# Patient Record
Sex: Female | Born: 1945
Health system: Southern US, Community
[De-identification: ages and names within clinical notes are randomized; demographics above are authoritative.]

## PROBLEM LIST (undated history)

## (undated) DIAGNOSIS — K5790 Diverticulosis of intestine, part unspecified, without perforation or abscess without bleeding: Secondary | ICD-10-CM

## (undated) DIAGNOSIS — T8859XA Other complications of anesthesia, initial encounter: Secondary | ICD-10-CM

## (undated) DIAGNOSIS — I10 Essential (primary) hypertension: Secondary | ICD-10-CM

## (undated) DIAGNOSIS — E7800 Pure hypercholesterolemia, unspecified: Secondary | ICD-10-CM

## (undated) DIAGNOSIS — C801 Malignant (primary) neoplasm, unspecified: Secondary | ICD-10-CM

## (undated) DIAGNOSIS — Z9889 Other specified postprocedural states: Secondary | ICD-10-CM

## (undated) HISTORY — PX: ABDOMINAL HYSTERECTOMY: SHX81

## (undated) HISTORY — DX: Essential (primary) hypertension: I10

## (undated) HISTORY — PX: BREAST LUMPECTOMY: SHX2

## (undated) HISTORY — DX: Pure hypercholesterolemia, unspecified: E78.00

## (undated) HISTORY — DX: Diverticulosis of intestine, part unspecified, without perforation or abscess without bleeding: K57.90

---

## 1998-08-20 ENCOUNTER — Emergency Department (HOSPITAL_COMMUNITY): Admission: EM | Admit: 1998-08-20 | Discharge: 1998-08-20 | Payer: Self-pay | Admitting: Emergency Medicine

## 1998-08-20 ENCOUNTER — Encounter: Payer: Self-pay | Admitting: Emergency Medicine

## 1999-11-01 ENCOUNTER — Encounter: Admission: RE | Admit: 1999-11-01 | Discharge: 1999-11-01 | Payer: Self-pay | Admitting: Family Medicine

## 1999-11-01 ENCOUNTER — Encounter: Payer: Self-pay | Admitting: Family Medicine

## 2002-01-29 ENCOUNTER — Encounter: Admission: RE | Admit: 2002-01-29 | Discharge: 2002-01-29 | Payer: Self-pay | Admitting: Family Medicine

## 2002-01-29 ENCOUNTER — Encounter: Payer: Self-pay | Admitting: Family Medicine

## 2004-07-22 ENCOUNTER — Encounter: Admission: RE | Admit: 2004-07-22 | Discharge: 2004-07-22 | Payer: Self-pay | Admitting: Family Medicine

## 2007-07-11 ENCOUNTER — Encounter: Admission: RE | Admit: 2007-07-11 | Discharge: 2007-07-11 | Payer: Self-pay | Admitting: Family Medicine

## 2009-11-16 ENCOUNTER — Emergency Department (HOSPITAL_COMMUNITY): Admission: EM | Admit: 2009-11-16 | Discharge: 2009-11-16 | Payer: Self-pay | Admitting: Emergency Medicine

## 2011-06-13 ENCOUNTER — Other Ambulatory Visit: Payer: Self-pay | Admitting: Family Medicine

## 2011-06-13 DIAGNOSIS — Z1231 Encounter for screening mammogram for malignant neoplasm of breast: Secondary | ICD-10-CM

## 2011-07-04 ENCOUNTER — Ambulatory Visit
Admission: RE | Admit: 2011-07-04 | Discharge: 2011-07-04 | Disposition: A | Discharge status: Home / Self Care | Payer: Medicare Other | Source: Ambulatory Visit | Attending: Family Medicine | Admitting: Family Medicine

## 2011-07-04 DIAGNOSIS — Z1231 Encounter for screening mammogram for malignant neoplasm of breast: Secondary | ICD-10-CM

## 2011-08-31 DIAGNOSIS — N951 Menopausal and female climacteric states: Secondary | ICD-10-CM | POA: Diagnosis not present

## 2011-08-31 DIAGNOSIS — E7800 Pure hypercholesterolemia, unspecified: Secondary | ICD-10-CM | POA: Diagnosis not present

## 2011-08-31 DIAGNOSIS — I10 Essential (primary) hypertension: Secondary | ICD-10-CM | POA: Diagnosis not present

## 2011-08-31 DIAGNOSIS — Z23 Encounter for immunization: Secondary | ICD-10-CM | POA: Diagnosis not present

## 2011-08-31 DIAGNOSIS — Z79899 Other long term (current) drug therapy: Secondary | ICD-10-CM | POA: Diagnosis not present

## 2011-08-31 DIAGNOSIS — K219 Gastro-esophageal reflux disease without esophagitis: Secondary | ICD-10-CM | POA: Diagnosis not present

## 2011-09-07 DIAGNOSIS — Z13820 Encounter for screening for osteoporosis: Secondary | ICD-10-CM | POA: Diagnosis not present

## 2011-09-07 DIAGNOSIS — N951 Menopausal and female climacteric states: Secondary | ICD-10-CM | POA: Diagnosis not present

## 2011-12-07 DIAGNOSIS — M779 Enthesopathy, unspecified: Secondary | ICD-10-CM | POA: Diagnosis not present

## 2011-12-07 DIAGNOSIS — M109 Gout, unspecified: Secondary | ICD-10-CM | POA: Diagnosis not present

## 2011-12-07 DIAGNOSIS — M7150 Other bursitis, not elsewhere classified, unspecified site: Secondary | ICD-10-CM | POA: Diagnosis not present

## 2011-12-21 DIAGNOSIS — M779 Enthesopathy, unspecified: Secondary | ICD-10-CM | POA: Diagnosis not present

## 2011-12-21 DIAGNOSIS — M109 Gout, unspecified: Secondary | ICD-10-CM | POA: Diagnosis not present

## 2012-01-18 DIAGNOSIS — M109 Gout, unspecified: Secondary | ICD-10-CM | POA: Diagnosis not present

## 2012-05-21 DIAGNOSIS — Z23 Encounter for immunization: Secondary | ICD-10-CM | POA: Diagnosis not present

## 2012-06-11 ENCOUNTER — Other Ambulatory Visit: Payer: Self-pay | Admitting: Family Medicine

## 2012-06-11 DIAGNOSIS — Z1231 Encounter for screening mammogram for malignant neoplasm of breast: Secondary | ICD-10-CM

## 2012-07-11 ENCOUNTER — Ambulatory Visit
Admission: RE | Admit: 2012-07-11 | Discharge: 2012-07-11 | Disposition: A | Discharge status: Home / Self Care | Payer: Medicare Other | Source: Ambulatory Visit | Attending: Family Medicine | Admitting: Family Medicine

## 2012-07-11 DIAGNOSIS — Z1231 Encounter for screening mammogram for malignant neoplasm of breast: Secondary | ICD-10-CM | POA: Diagnosis not present

## 2012-09-12 DIAGNOSIS — I10 Essential (primary) hypertension: Secondary | ICD-10-CM | POA: Diagnosis not present

## 2012-09-12 DIAGNOSIS — E7800 Pure hypercholesterolemia, unspecified: Secondary | ICD-10-CM | POA: Diagnosis not present

## 2012-09-12 DIAGNOSIS — K219 Gastro-esophageal reflux disease without esophagitis: Secondary | ICD-10-CM | POA: Diagnosis not present

## 2012-09-12 DIAGNOSIS — N951 Menopausal and female climacteric states: Secondary | ICD-10-CM | POA: Diagnosis not present

## 2012-09-12 DIAGNOSIS — Z23 Encounter for immunization: Secondary | ICD-10-CM | POA: Diagnosis not present

## 2012-09-12 DIAGNOSIS — Z79899 Other long term (current) drug therapy: Secondary | ICD-10-CM | POA: Diagnosis not present

## 2013-05-15 DIAGNOSIS — Z23 Encounter for immunization: Secondary | ICD-10-CM | POA: Diagnosis not present

## 2013-07-10 ENCOUNTER — Other Ambulatory Visit: Payer: Self-pay

## 2013-07-10 DIAGNOSIS — Z1231 Encounter for screening mammogram for malignant neoplasm of breast: Secondary | ICD-10-CM

## 2013-09-09 ENCOUNTER — Ambulatory Visit
Admission: RE | Admit: 2013-09-09 | Discharge: 2013-09-09 | Disposition: A | Discharge status: Home / Self Care | Payer: Medicare Other | Source: Ambulatory Visit

## 2013-09-09 DIAGNOSIS — Z1231 Encounter for screening mammogram for malignant neoplasm of breast: Secondary | ICD-10-CM | POA: Diagnosis not present

## 2013-09-18 DIAGNOSIS — I10 Essential (primary) hypertension: Secondary | ICD-10-CM | POA: Diagnosis not present

## 2013-09-18 DIAGNOSIS — M949 Disorder of cartilage, unspecified: Secondary | ICD-10-CM | POA: Diagnosis not present

## 2013-09-18 DIAGNOSIS — M109 Gout, unspecified: Secondary | ICD-10-CM | POA: Diagnosis not present

## 2013-09-18 DIAGNOSIS — E7800 Pure hypercholesterolemia, unspecified: Secondary | ICD-10-CM | POA: Diagnosis not present

## 2013-09-18 DIAGNOSIS — M899 Disorder of bone, unspecified: Secondary | ICD-10-CM | POA: Diagnosis not present

## 2014-05-05 DIAGNOSIS — Z23 Encounter for immunization: Secondary | ICD-10-CM | POA: Diagnosis not present

## 2014-06-01 ENCOUNTER — Telehealth: Payer: Self-pay | Admitting: *Deleted

## 2014-06-01 NOTE — Telephone Encounter (Signed)
My doctor was Ermalinda Barrios I think.  Please call me.  I have already responded to this.

## 2014-06-01 NOTE — Telephone Encounter (Signed)
I'm a patient of [facility] or something like that.  My Gout has flared up and just need to see if he will call me in a prescription.  I called the patient and informed her that Dr. Janus Molder no longer works here.  I told her she can schedule an appointment with another doctor.  She stated, "I really don't need to be seen.  He's treated me for it before.  I just need a prescription."  I told her that they cannot write her a prescription without seeing her.  She stated, "Well nothing has changed.  You still have my chart don't you?  I would rather not have to go through those needles again.  Will that have to be done?"  I told her we may not have that chart.  We are a part of [facility] now and we are on electronic medical records.  You will not have to have all those needles again.  I asked if she would like to schedule an appointment.  She stated, "I know you don't have anything today.  Do you have anything for tomorrow?"  I told her I would have to transfer her to a scheduler.

## 2014-06-02 ENCOUNTER — Ambulatory Visit (INDEPENDENT_AMBULATORY_CARE_PROVIDER_SITE_OTHER): Payer: Medicare Other

## 2014-06-02 ENCOUNTER — Encounter: Payer: Self-pay | Admitting: Podiatry

## 2014-06-02 ENCOUNTER — Ambulatory Visit: Payer: Self-pay

## 2014-06-02 ENCOUNTER — Ambulatory Visit (INDEPENDENT_AMBULATORY_CARE_PROVIDER_SITE_OTHER): Payer: Medicare Other | Admitting: Podiatry

## 2014-06-02 VITALS — BP 147/88 | HR 62 | Resp 16 | Ht 65.0 in | Wt 160.0 lb

## 2014-06-02 DIAGNOSIS — M778 Other enthesopathies, not elsewhere classified: Secondary | ICD-10-CM

## 2014-06-02 DIAGNOSIS — M109 Gout, unspecified: Secondary | ICD-10-CM

## 2014-06-02 DIAGNOSIS — M779 Enthesopathy, unspecified: Secondary | ICD-10-CM

## 2014-06-02 DIAGNOSIS — M10071 Idiopathic gout, right ankle and foot: Secondary | ICD-10-CM

## 2014-06-02 DIAGNOSIS — M7750 Other enthesopathy of unspecified foot: Secondary | ICD-10-CM | POA: Diagnosis not present

## 2014-06-02 MED ORDER — INDOMETHACIN 25 MG PO CAPS: 25.0000 mg | ORAL_CAPSULE | Freq: Two times a day (BID) | ORAL | Status: DC

## 2014-06-02 NOTE — Progress Notes (Signed)
   Subjective:    Patient ID: Melanie Kirby, female    DOB: 05-30-1946, 68 y.o.   MRN: [id number]  HPI Comments: Woke up Saturday morning with the foot swollen and painful , feels like bee stings.     Review of Systems     Objective:   Physical Exam: I have reviewed her past medical history medications allergies surgeries social history and review of systems. Pulses are strongly palpable bilateral. Neurologic sensorium is intact per Semmes-Weinstein monofilament. Deep tendon reflexes are intact bilateral. Muscle strength is 5 over 5 dorsiflexors plantar flexors inverters everters all intrinsic musculature is intact. She has tingling and bee stings on the hallux right greater than left foot are not reproducible with palpation. She also has pain on palpation of the fifth metatarsal base of the right foot with mild edema. Radiographic evaluation does not demonstrate any type of osseous abnormalities or soft tissue irregularities in these areas.        Assessment & Plan:  Assessment: We cannot rule out gout attack because she has previously had one. However this does appear to be more of an overuse injury than gouty arthritis.  Plan: Started her on indomethacin 25 mg 1 by mouth twice a day followup with me as needed.

## 2014-07-07 ENCOUNTER — Encounter: Payer: Self-pay | Admitting: Podiatry

## 2014-07-07 ENCOUNTER — Ambulatory Visit (INDEPENDENT_AMBULATORY_CARE_PROVIDER_SITE_OTHER): Payer: Medicare Other

## 2014-07-07 ENCOUNTER — Ambulatory Visit (INDEPENDENT_AMBULATORY_CARE_PROVIDER_SITE_OTHER): Payer: Medicare Other | Admitting: Podiatry

## 2014-07-07 VITALS — BP 135/68 | HR 68 | Resp 16

## 2014-07-07 DIAGNOSIS — M10072 Idiopathic gout, left ankle and foot: Secondary | ICD-10-CM

## 2014-07-07 DIAGNOSIS — M7752 Other enthesopathy of left foot: Secondary | ICD-10-CM

## 2014-07-07 DIAGNOSIS — M109 Gout, unspecified: Secondary | ICD-10-CM

## 2014-07-07 DIAGNOSIS — M779 Enthesopathy, unspecified: Secondary | ICD-10-CM

## 2014-07-07 DIAGNOSIS — M778 Other enthesopathies, not elsewhere classified: Secondary | ICD-10-CM

## 2014-07-07 MED ORDER — COLCHICINE 0.6 MG PO TABS: ORAL_TABLET | ORAL | Status: DC

## 2014-07-07 NOTE — Progress Notes (Signed)
She presents today for a chief complaint of a gout flareup first metatarsophalangeal joint to her left foot. She denies any trauma.  Objective: Vital signs are stable she is alert and oriented 3. Pulses are strongly palpable left foot. First metatarsophalangeal joint is red-hot swollen and painful. Radiographic evaluation does demonstrate hallux abductovalgus deformity with early osteoarthritic changes. Soft tissue increase in density periarticular. Findings consistent with inflammation such as gout.  Assessment: Gouty capsulitis first metatarsophalangeal joint left foot.  Plan: Start her on colchicine. Injected Kenalog first metatarsophalangeal joint left foot. Follow-up with me in 1 month.

## 2014-07-08 ENCOUNTER — Telehealth: Payer: Self-pay | Admitting: *Deleted

## 2014-07-09 NOTE — Telephone Encounter (Signed)
I'm Dr. Stephenie Acres patient.  I came to the office yesterday about my Gout and he prescribed me a medication that I cannot afford.  It's $375 for 30 pills.  I'm not able to get that medication.  My pharmacist said there is another medication that is a little more reasonable.  It's Allopurinol.  If you could call in a prescription for that maybe I can take it.  I just couldn't afford the other medication.  If you would call me at work phone number.  Thank you so much.

## 2014-07-09 NOTE — Telephone Encounter (Signed)
Allopurinol is not for an acute gout attack.  Colchicine is generic now it should not be that much.  Otherwise just continue the indocin.

## 2014-07-09 NOTE — Telephone Encounter (Signed)
I called and informed her that Dr. Milinda Pointer said Allopurinol is not for acute Gout attacks.  He said the Colchicine is generic now shouldn't have cost that much.  He said to just continue to take the Indocin.  "I said whew we when they told me the price of that medicine and I had said I would just keep taking my old medicine.  Thank you for calling."

## 2014-07-21 ENCOUNTER — Ambulatory Visit: Payer: Medicare Other | Admitting: Podiatry

## 2014-08-04 ENCOUNTER — Other Ambulatory Visit: Payer: Self-pay

## 2014-08-04 DIAGNOSIS — Z1231 Encounter for screening mammogram for malignant neoplasm of breast: Secondary | ICD-10-CM

## 2014-09-10 ENCOUNTER — Ambulatory Visit
Admission: RE | Admit: 2014-09-10 | Discharge: 2014-09-10 | Disposition: A | Discharge status: Home / Self Care | Payer: Medicare Other | Source: Ambulatory Visit

## 2014-09-10 DIAGNOSIS — Z1231 Encounter for screening mammogram for malignant neoplasm of breast: Secondary | ICD-10-CM

## 2014-09-22 DIAGNOSIS — M109 Gout, unspecified: Secondary | ICD-10-CM | POA: Diagnosis not present

## 2014-09-22 DIAGNOSIS — I10 Essential (primary) hypertension: Secondary | ICD-10-CM | POA: Diagnosis not present

## 2014-09-22 DIAGNOSIS — M8580 Other specified disorders of bone density and structure, unspecified site: Secondary | ICD-10-CM | POA: Diagnosis not present

## 2014-09-22 DIAGNOSIS — Z23 Encounter for immunization: Secondary | ICD-10-CM | POA: Diagnosis not present

## 2014-09-22 DIAGNOSIS — E780 Pure hypercholesterolemia: Secondary | ICD-10-CM | POA: Diagnosis not present

## 2014-10-20 DIAGNOSIS — M859 Disorder of bone density and structure, unspecified: Secondary | ICD-10-CM | POA: Diagnosis not present

## 2014-10-20 DIAGNOSIS — M8580 Other specified disorders of bone density and structure, unspecified site: Secondary | ICD-10-CM | POA: Diagnosis not present

## 2015-03-23 DIAGNOSIS — M109 Gout, unspecified: Secondary | ICD-10-CM | POA: Diagnosis not present

## 2015-03-23 DIAGNOSIS — E780 Pure hypercholesterolemia: Secondary | ICD-10-CM | POA: Diagnosis not present

## 2015-03-23 DIAGNOSIS — I10 Essential (primary) hypertension: Secondary | ICD-10-CM | POA: Diagnosis not present

## 2015-03-23 DIAGNOSIS — M8588 Other specified disorders of bone density and structure, other site: Secondary | ICD-10-CM | POA: Diagnosis not present

## 2015-03-23 DIAGNOSIS — K219 Gastro-esophageal reflux disease without esophagitis: Secondary | ICD-10-CM | POA: Diagnosis not present

## 2015-03-23 DIAGNOSIS — R809 Proteinuria, unspecified: Secondary | ICD-10-CM | POA: Diagnosis not present

## 2015-04-11 DIAGNOSIS — L259 Unspecified contact dermatitis, unspecified cause: Secondary | ICD-10-CM | POA: Diagnosis not present

## 2015-05-01 DIAGNOSIS — L259 Unspecified contact dermatitis, unspecified cause: Secondary | ICD-10-CM | POA: Diagnosis not present

## 2015-05-15 DIAGNOSIS — Z23 Encounter for immunization: Secondary | ICD-10-CM | POA: Diagnosis not present

## 2015-08-10 ENCOUNTER — Other Ambulatory Visit: Payer: Self-pay

## 2015-08-10 DIAGNOSIS — Z1231 Encounter for screening mammogram for malignant neoplasm of breast: Secondary | ICD-10-CM

## 2015-09-14 ENCOUNTER — Ambulatory Visit
Admission: RE | Admit: 2015-09-14 | Discharge: 2015-09-14 | Disposition: A | Discharge status: Home / Self Care | Payer: Medicare Other | Source: Ambulatory Visit

## 2015-09-14 DIAGNOSIS — Z1231 Encounter for screening mammogram for malignant neoplasm of breast: Secondary | ICD-10-CM

## 2015-10-07 DIAGNOSIS — M8580 Other specified disorders of bone density and structure, unspecified site: Secondary | ICD-10-CM | POA: Diagnosis not present

## 2015-10-07 DIAGNOSIS — I10 Essential (primary) hypertension: Secondary | ICD-10-CM | POA: Diagnosis not present

## 2015-10-07 DIAGNOSIS — R809 Proteinuria, unspecified: Secondary | ICD-10-CM | POA: Diagnosis not present

## 2015-10-07 DIAGNOSIS — E7800 Pure hypercholesterolemia, unspecified: Secondary | ICD-10-CM | POA: Diagnosis not present

## 2015-10-07 DIAGNOSIS — Z1211 Encounter for screening for malignant neoplasm of colon: Secondary | ICD-10-CM | POA: Diagnosis not present

## 2015-10-07 DIAGNOSIS — K219 Gastro-esophageal reflux disease without esophagitis: Secondary | ICD-10-CM | POA: Diagnosis not present

## 2015-10-07 DIAGNOSIS — M109 Gout, unspecified: Secondary | ICD-10-CM | POA: Diagnosis not present

## 2015-11-18 DIAGNOSIS — D126 Benign neoplasm of colon, unspecified: Secondary | ICD-10-CM | POA: Diagnosis not present

## 2015-11-18 DIAGNOSIS — D120 Benign neoplasm of cecum: Secondary | ICD-10-CM | POA: Diagnosis not present

## 2015-11-18 DIAGNOSIS — K5730 Diverticulosis of large intestine without perforation or abscess without bleeding: Secondary | ICD-10-CM | POA: Diagnosis not present

## 2015-11-18 DIAGNOSIS — Z86010 Personal history of colonic polyps: Secondary | ICD-10-CM | POA: Diagnosis not present

## 2015-11-18 DIAGNOSIS — D125 Benign neoplasm of sigmoid colon: Secondary | ICD-10-CM | POA: Diagnosis not present

## 2015-11-18 DIAGNOSIS — D124 Benign neoplasm of descending colon: Secondary | ICD-10-CM | POA: Diagnosis not present

## 2015-11-18 DIAGNOSIS — K621 Rectal polyp: Secondary | ICD-10-CM | POA: Diagnosis not present

## 2016-04-13 DIAGNOSIS — K219 Gastro-esophageal reflux disease without esophagitis: Secondary | ICD-10-CM | POA: Diagnosis not present

## 2016-04-13 DIAGNOSIS — I10 Essential (primary) hypertension: Secondary | ICD-10-CM | POA: Diagnosis not present

## 2016-04-13 DIAGNOSIS — M8580 Other specified disorders of bone density and structure, unspecified site: Secondary | ICD-10-CM | POA: Diagnosis not present

## 2016-04-13 DIAGNOSIS — E7800 Pure hypercholesterolemia, unspecified: Secondary | ICD-10-CM | POA: Diagnosis not present

## 2016-04-13 DIAGNOSIS — R809 Proteinuria, unspecified: Secondary | ICD-10-CM | POA: Diagnosis not present

## 2016-04-13 DIAGNOSIS — M109 Gout, unspecified: Secondary | ICD-10-CM | POA: Diagnosis not present

## 2016-05-09 DIAGNOSIS — Z23 Encounter for immunization: Secondary | ICD-10-CM | POA: Diagnosis not present

## 2016-08-31 ENCOUNTER — Other Ambulatory Visit: Payer: Self-pay | Admitting: Family Medicine

## 2016-08-31 DIAGNOSIS — Z1231 Encounter for screening mammogram for malignant neoplasm of breast: Secondary | ICD-10-CM

## 2016-09-20 ENCOUNTER — Ambulatory Visit
Admission: RE | Admit: 2016-09-20 | Discharge: 2016-09-20 | Disposition: A | Discharge status: Home / Self Care | Payer: Medicare Other | Source: Ambulatory Visit | Attending: Family Medicine | Admitting: Family Medicine

## 2016-09-20 DIAGNOSIS — Z1231 Encounter for screening mammogram for malignant neoplasm of breast: Secondary | ICD-10-CM

## 2016-09-20 IMAGING — MG MM SCREEN MAMMOGRAM BILATERAL
4 series · 4 of 4 positions shown · non-contrast
Comparison: Previous exam(s).

CLINICAL DATA: Screening.

EXAM:
DIGITAL SCREENING BILATERAL MAMMOGRAM WITH CAD

[R CC]
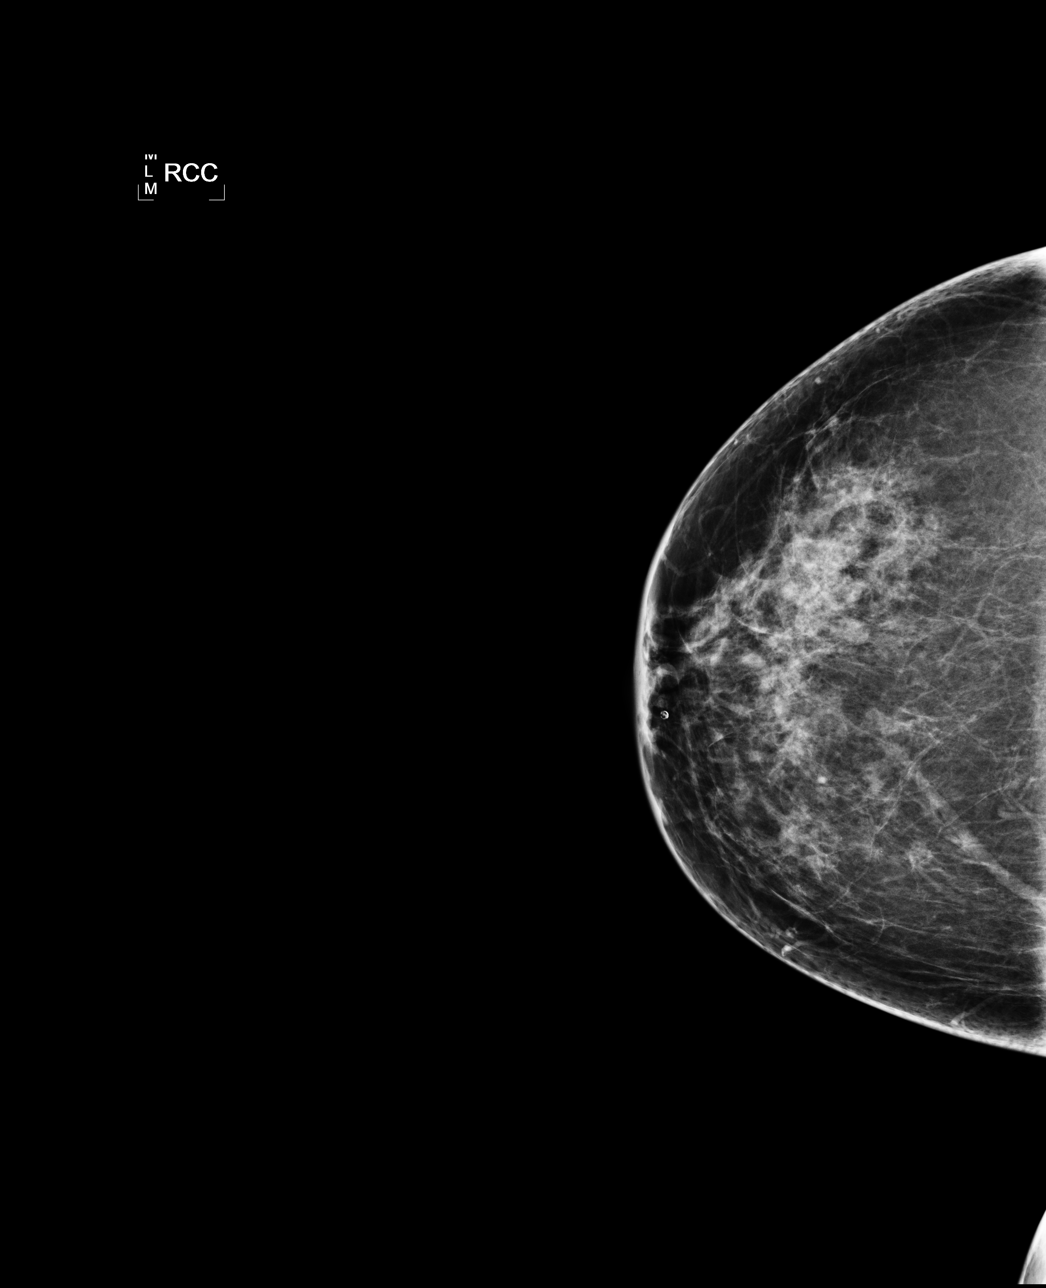

[L CC]
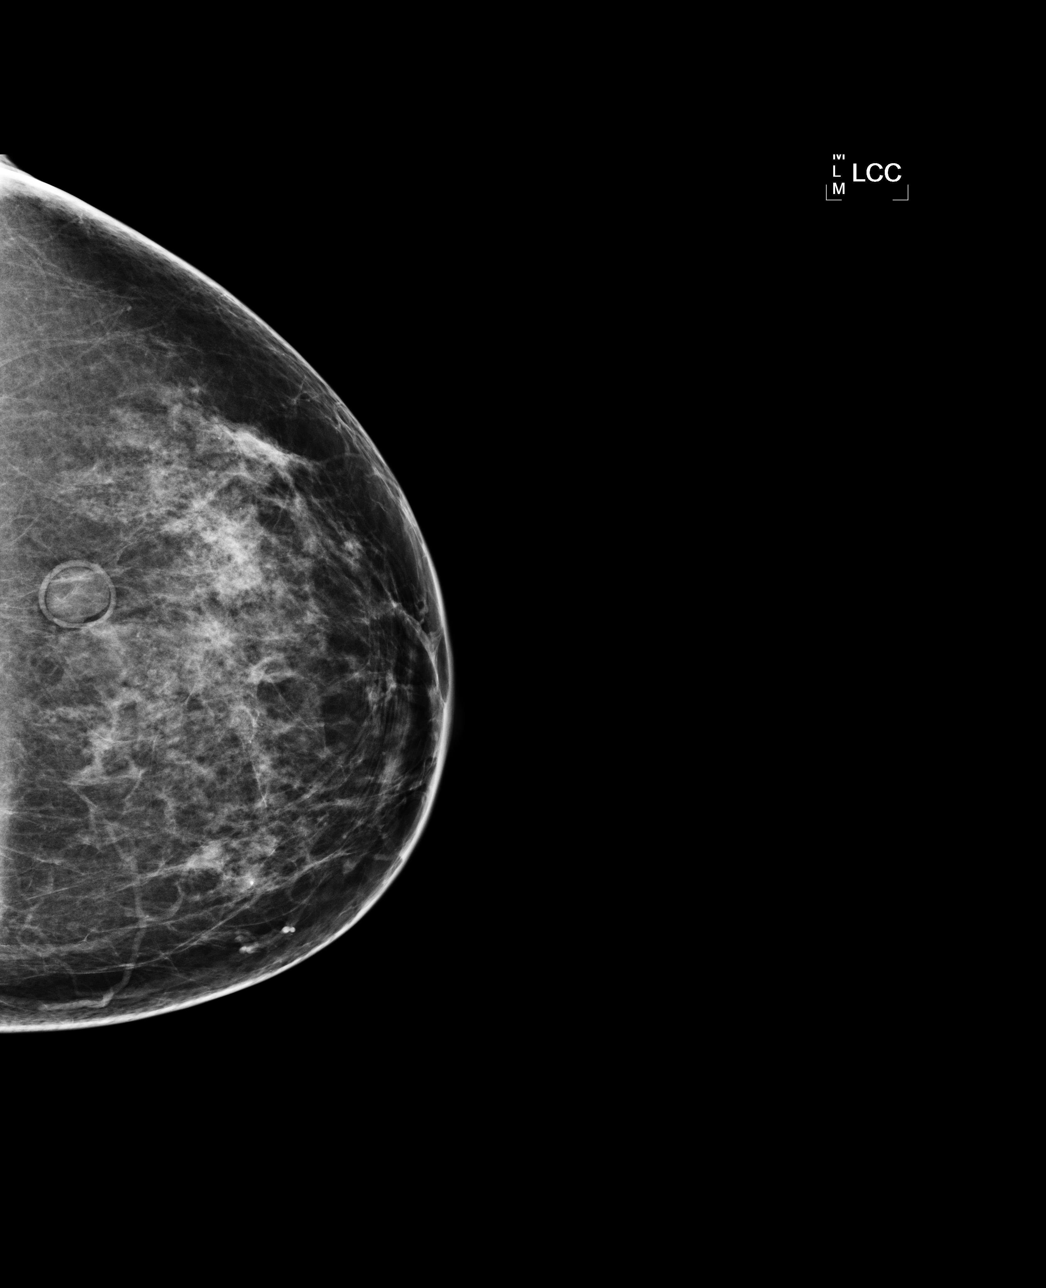

[L MLO]
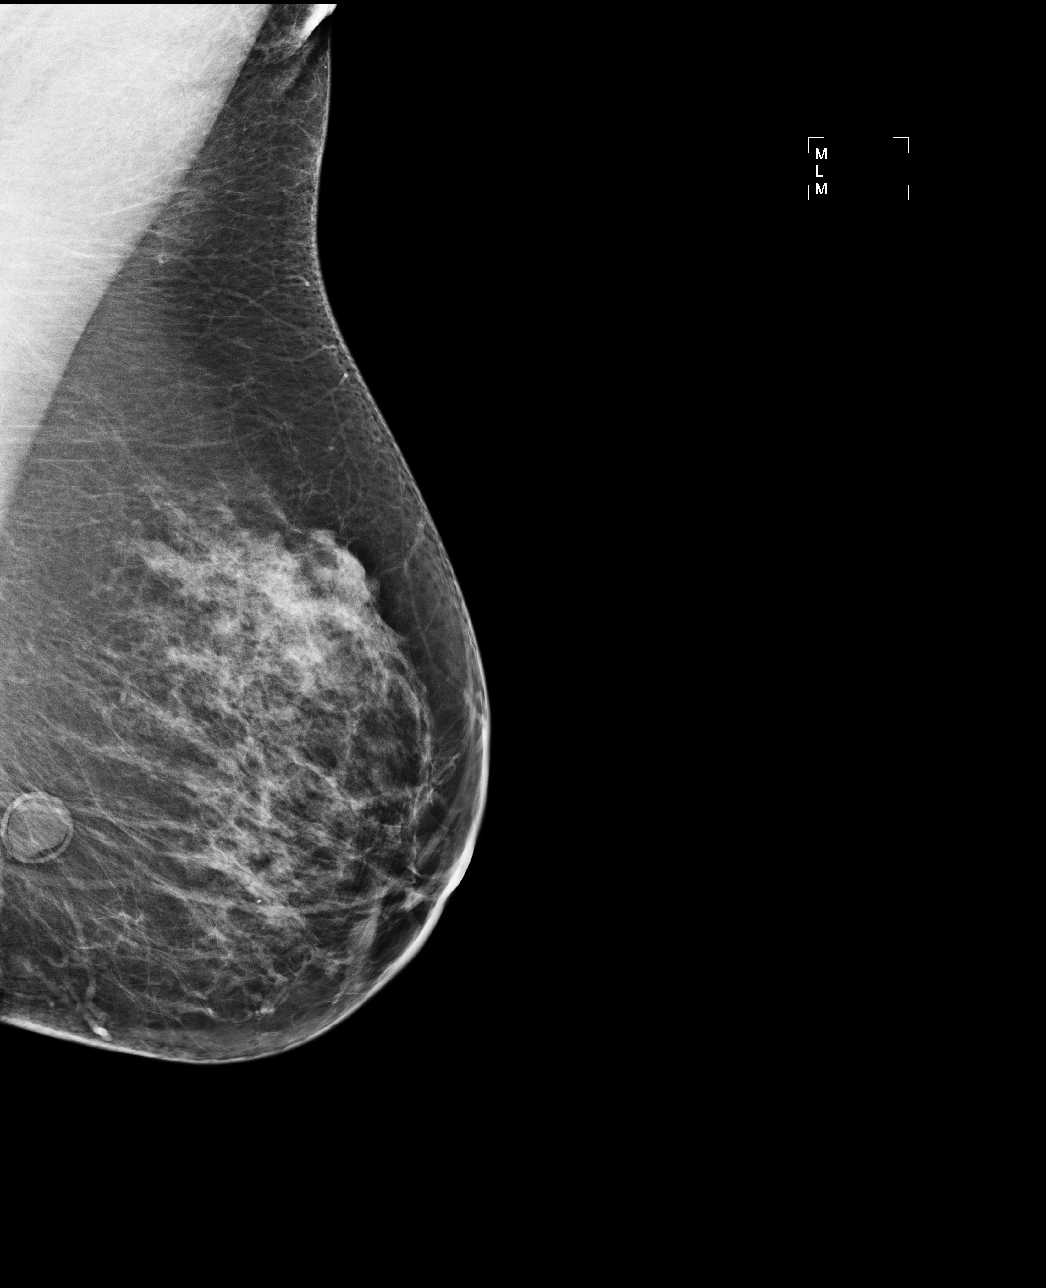

[R MLO]
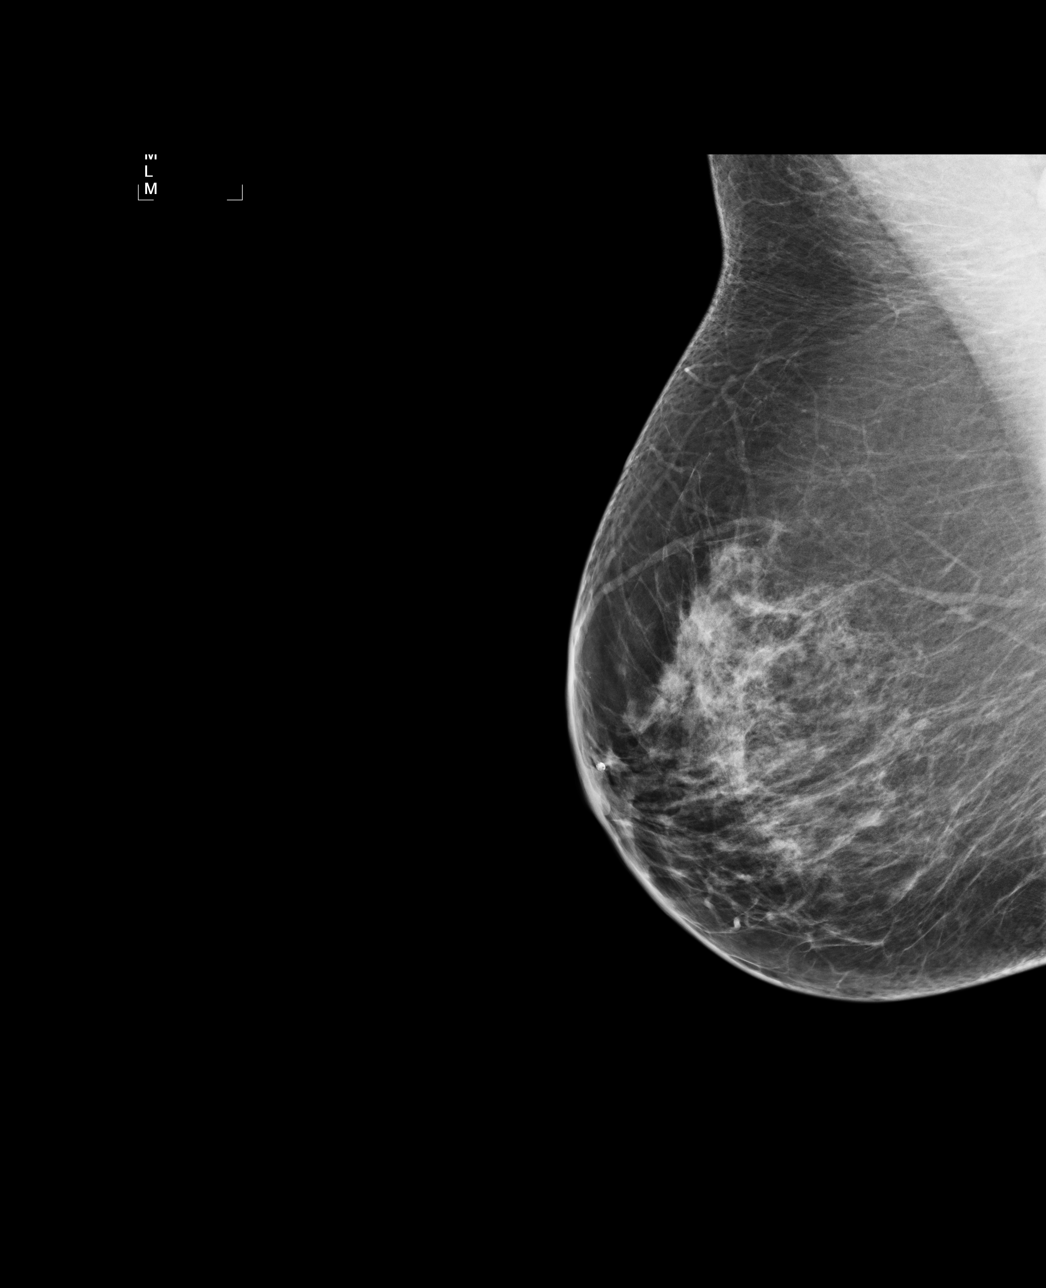

[4 of 4 positions shown; findings below may reference images not displayed]

ACR Breast Density Category d: The breast tissue is extremely dense,
which lowers the sensitivity of mammography.
FINDINGS: There are no findings suspicious for malignancy. Images were
processed with CAD.
IMPRESSION: No mammographic evidence of malignancy. A result letter of this
screening mammogram will be mailed directly to the patient.

RECOMMENDATION:
Screening mammogram in one year. (Code:BD-D-K0F)

BI-RADS CATEGORY  1: Negative.

## 2017-08-29 ENCOUNTER — Other Ambulatory Visit: Payer: Self-pay | Admitting: Family Medicine

## 2017-08-29 DIAGNOSIS — Z1231 Encounter for screening mammogram for malignant neoplasm of breast: Secondary | ICD-10-CM

## 2017-09-13 ENCOUNTER — Encounter: Payer: Self-pay | Admitting: Podiatry

## 2017-09-13 ENCOUNTER — Ambulatory Visit: Payer: Medicare Other | Admitting: Podiatry

## 2017-09-13 ENCOUNTER — Ambulatory Visit (INDEPENDENT_AMBULATORY_CARE_PROVIDER_SITE_OTHER): Payer: Medicare Other

## 2017-09-13 DIAGNOSIS — M109 Gout, unspecified: Secondary | ICD-10-CM

## 2017-09-13 MED ORDER — COLCHICINE 0.6 MG PO TABS: ORAL_TABLET | ORAL | 3 refills | Status: DC

## 2017-09-13 MED ORDER — INDOMETHACIN 25 MG PO CAPS: 25.0000 mg | ORAL_CAPSULE | Freq: Three times a day (TID) | ORAL | 1 refills | Status: DC

## 2017-09-13 NOTE — Progress Notes (Signed)
  Subjective:  Patient ID: Melanie Kirby, female    DOB: Sep 03, 1945,  MRN: [id number] HPI Chief Complaint  Patient presents with  . Foot Pain    1st MPJ right - "gout flare", redness, swelling, last flare in July 2018-went to [facility] - they gave injection, colchicin, allopurinol, stopped meds after foot was better, had current flare x 3 weeks, better, but not well    72 y.o. female presents with the above complaint.     Past Medical History:  Diagnosis Date  . High cholesterol   . Hypertension      Current Outpatient Medications:  Marland Kitchen  Multiple Vitamin (MULTIVITAMIN) capsule, Take 1 capsule by mouth daily., Disp: , Rfl:  .  Omega-3 Fatty Acids (FISH OIL PO), Take by mouth., Disp: , Rfl:  .  benazepril (LOTENSIN) 40 MG tablet, , Disp: , Rfl:  .  colchicine 0.6 MG tablet, Take one tablet by mouth every two hours to a maximum of three tablets or until upset stomach.  Then one by mouth daily there after., Disp: 60 tablet, Rfl: 1 .  simvastatin (ZOCOR) 40 MG tablet, , Disp: , Rfl:   No Known Allergies Review of Systems  Musculoskeletal: Positive for arthralgias.  All other systems reviewed and are negative.  Objective:  There were no vitals filed for this visit.  General: Well developed, nourished, in no acute distress, alert and oriented x3   Dermatological: Skin is warm, dry and supple bilateral. Nails x 10 are well maintained; remaining integument appears unremarkable at this time. There are no open sores, no preulcerative lesions, no rash or signs of infection present.  Vascular: Dorsalis Pedis artery and Posterior Tibial artery pedal pulses are 2/4 bilateral with immedate capillary fill time. Pedal hair growth present. No varicosities and no lower extremity edema present bilateral.   Neruologic: Grossly intact via light touch bilateral. Vibratory intact via tuning fork bilateral. Protective threshold with Semmes Wienstein monofilament intact to all pedal sites bilateral.  Patellar and Achilles deep tendon reflexes 2+ bilateral. No Babinski or clonus noted bilateral.   Musculoskeletal: No gross boney pedal deformities bilateral. No pain, crepitus, or limitation noted with foot and ankle range of motion bilateral. Muscular strength 5/5 in all groups tested bilateral.  Gait: Unassisted, Nonantalgic.    Radiographs:  3 views right foot were taken in the office today demonstrating relatively normal anatomical rectus foot type.  She does have joint space narrowing of the first metatarsophalangeal joint with soft tissue thickening around the joint.  This is most consistent with gout.  Or swelling around the joint.  There is some cystic degeneration of the joint.  Again consistent with gout.  Assessment & Plan:   Assessment: Gouty capsulitis first metatarsophalangeal joint right foot resolving.  Plan: Start her back on colchicine daily allopurinol was causing muscle pain for her.  She cannot use [facility] [place] in the past with her primary doctor.  I also provided her with 25 mg of indomethacin if necessary.  Offered her injection she declined.  Follow-up with [place] as needed.  Recommend that she follow-up with [place] immediately upon developing a gout attack.       Melanie Kirby T. [place], [place]

## 2017-09-21 ENCOUNTER — Ambulatory Visit
Admission: RE | Admit: 2017-09-21 | Discharge: 2017-09-21 | Disposition: A | Discharge status: Home / Self Care | Payer: Medicare Other | Source: Ambulatory Visit | Attending: Family Medicine | Admitting: Family Medicine

## 2017-09-21 DIAGNOSIS — Z1231 Encounter for screening mammogram for malignant neoplasm of breast: Secondary | ICD-10-CM

## 2018-08-14 ENCOUNTER — Other Ambulatory Visit: Payer: Self-pay | Admitting: Family Medicine

## 2018-08-14 DIAGNOSIS — Z1231 Encounter for screening mammogram for malignant neoplasm of breast: Secondary | ICD-10-CM

## 2018-09-23 ENCOUNTER — Other Ambulatory Visit: Payer: Self-pay | Admitting: Podiatry

## 2018-09-24 ENCOUNTER — Telehealth: Payer: Self-pay | Admitting: Podiatry

## 2018-09-24 ENCOUNTER — Ambulatory Visit
Admission: RE | Admit: 2018-09-24 | Discharge: 2018-09-24 | Disposition: A | Discharge status: Home / Self Care | Payer: Medicare Other | Source: Ambulatory Visit | Attending: Family Medicine | Admitting: Family Medicine

## 2018-09-24 DIAGNOSIS — Z1231 Encounter for screening mammogram for malignant neoplasm of breast: Secondary | ICD-10-CM

## 2018-09-24 NOTE — Telephone Encounter (Signed)
I have gout and need my colchicine refilled. I use Melanie Kirby at [facility].

## 2018-09-24 NOTE — Telephone Encounter (Signed)
Left message informing pt Dr. Milinda Pointer had wanted her to make an appt immediately if she had a gout flare.

## 2018-10-01 ENCOUNTER — Ambulatory Visit: Payer: Medicare Other | Admitting: Podiatry

## 2018-10-01 ENCOUNTER — Encounter: Payer: Self-pay | Admitting: Podiatry

## 2018-10-01 DIAGNOSIS — M109 Gout, unspecified: Secondary | ICD-10-CM | POA: Diagnosis not present

## 2018-10-01 MED ORDER — COLCHICINE 0.6 MG PO TABS: ORAL_TABLET | ORAL | 3 refills | Status: DC

## 2018-10-01 NOTE — Progress Notes (Signed)
She presents today for refill of her colchicine.  She is taking a daily maintenance dose and is currently out of the medication and she does not want another flareup.  She states that it seems that the colchicine has been taking care of even her plantar fasciitis and the pain across the dorsum of her foot.  She denies any gout flareups in the past year denies any change in her past medical history medications or allergies.  Objective: Vital signs are stable alert oriented x3 there is no erythema edema cellulitis drainage or odor no change in her physical exam.  Assessment: History of gout.  Plan: Discussed etiology pathology and surgical therapies follow-up with her in 1 year.  Dispensed a prescription for colchicine 0.6 mg 1 tablet daily.

## 2019-08-18 ENCOUNTER — Other Ambulatory Visit: Payer: Self-pay | Admitting: Family Medicine

## 2019-08-18 DIAGNOSIS — Z1231 Encounter for screening mammogram for malignant neoplasm of breast: Secondary | ICD-10-CM

## 2019-09-30 ENCOUNTER — Ambulatory Visit (INDEPENDENT_AMBULATORY_CARE_PROVIDER_SITE_OTHER): Payer: Medicare Other

## 2019-09-30 ENCOUNTER — Ambulatory Visit (INDEPENDENT_AMBULATORY_CARE_PROVIDER_SITE_OTHER): Payer: Medicare Other | Admitting: Podiatry

## 2019-09-30 ENCOUNTER — Encounter: Payer: Self-pay | Admitting: Podiatry

## 2019-09-30 ENCOUNTER — Other Ambulatory Visit: Payer: Self-pay

## 2019-09-30 DIAGNOSIS — M778 Other enthesopathies, not elsewhere classified: Secondary | ICD-10-CM

## 2019-09-30 DIAGNOSIS — M109 Gout, unspecified: Secondary | ICD-10-CM

## 2019-09-30 MED ORDER — METHYLPREDNISOLONE 4 MG PO TBPK
ORAL_TABLET | ORAL | 0 refills | Status: DC
Start: 2019-09-30 — End: 2021-09-06

## 2019-09-30 MED ORDER — COLCHICINE 0.6 MG PO TABS: ORAL_TABLET | ORAL | 3 refills | Status: DC

## 2019-09-30 NOTE — Progress Notes (Signed)
She presents today after having not seen her since 1 year ago states that she ran out of gout medicine, colchicine about a week ago and sure enough she developed a gout just a few days ago.  States that the first metatarsophalangeal joint and dorsal midfoot is exquisitely painful and now the foot is red and painful right.  She denies any trauma denies any new past medical history medications allergies surgeries or social history.  Objective: Pulses are palpable.  The dorsum of the foot is red and painful.  Mildly edematous tender to the touch.  Radiographs taken today demonstrate osteoarthritis first metatarsophalangeal joint and second metatarsal intermediate cuneiform joint.  Assessment: Gouty arthritis right foot.  Plan: Started by her back on colchicine today.  I injected the area with 20 mg Kenalog 5 mg of Marcaine dorsal aspect of the right foot.  Also started on a Medrol Dosepak.

## 2019-10-03 ENCOUNTER — Ambulatory Visit
Admission: RE | Admit: 2019-10-03 | Discharge: 2019-10-03 | Disposition: A | Discharge status: Home / Self Care | Payer: Medicare Other | Source: Ambulatory Visit | Attending: Family Medicine | Admitting: Family Medicine

## 2019-10-03 ENCOUNTER — Other Ambulatory Visit: Payer: Self-pay

## 2019-10-03 DIAGNOSIS — Z1231 Encounter for screening mammogram for malignant neoplasm of breast: Secondary | ICD-10-CM

## 2020-08-26 ENCOUNTER — Other Ambulatory Visit: Payer: Self-pay | Admitting: Family Medicine

## 2020-08-26 DIAGNOSIS — Z1231 Encounter for screening mammogram for malignant neoplasm of breast: Secondary | ICD-10-CM

## 2020-08-31 ENCOUNTER — Ambulatory Visit (INDEPENDENT_AMBULATORY_CARE_PROVIDER_SITE_OTHER): Payer: Medicare Other | Admitting: Podiatry

## 2020-08-31 ENCOUNTER — Other Ambulatory Visit: Payer: Self-pay

## 2020-08-31 DIAGNOSIS — M778 Other enthesopathies, not elsewhere classified: Secondary | ICD-10-CM

## 2020-08-31 DIAGNOSIS — M109 Gout, unspecified: Secondary | ICD-10-CM | POA: Diagnosis not present

## 2020-08-31 MED ORDER — COLCHICINE 0.6 MG PO TABS: 0.6000 mg | ORAL_TABLET | Freq: Every day | ORAL | 3 refills | Status: DC

## 2020-08-31 MED ORDER — NEOMYCIN-POLYMYXIN-HC 3.5-10000-1 OT SOLN: OTIC | 0 refills | Status: DC

## 2020-08-31 NOTE — Progress Notes (Signed)
She presents today for annual foot examination.  States that she needs a refill on her colchicine will be running out soon in a similar thing keep her gout under control.  She denies any additions to her medication list she not denies any withdrawals from her medication list.  She denies any change in her past medical history medications allergies surgeries or social history.  Objective: Vital signs are stable she alert oriented x3 pulses are palpable.  There is no erythema edema cellulitis drainage or odor.  She does have a subungual hematoma fibular side of the hallux left no signs of infection.  She has neurologic sensorium is intact per Semmes Weinstein monofilament deep tendon reflexes are intact muscle strength is normal symmetrical bilateral.  Orthopedic evaluation demonstrates normal rectus foot no complications or pain on range of motion of the toes.  Assessment: Subungual hematoma hallux left no signs of infection and history of hyperuricemia.  Plan: I will follow-up with her in 1 year should she have questions or concerns before then she will notify us immediately.  I did refill her colchicine for her.

## 2020-10-20 ENCOUNTER — Ambulatory Visit: Payer: Medicare Other

## 2020-11-10 DIAGNOSIS — M109 Gout, unspecified: Secondary | ICD-10-CM | POA: Diagnosis not present

## 2020-11-10 DIAGNOSIS — I10 Essential (primary) hypertension: Secondary | ICD-10-CM | POA: Diagnosis not present

## 2020-11-10 DIAGNOSIS — K219 Gastro-esophageal reflux disease without esophagitis: Secondary | ICD-10-CM | POA: Diagnosis not present

## 2020-11-10 DIAGNOSIS — E7800 Pure hypercholesterolemia, unspecified: Secondary | ICD-10-CM | POA: Diagnosis not present

## 2020-11-10 DIAGNOSIS — Z0000 Encounter for general adult medical examination without abnormal findings: Secondary | ICD-10-CM | POA: Diagnosis not present

## 2020-12-06 ENCOUNTER — Inpatient Hospital Stay: Admission: RE | Admit: 2020-12-06 | Payer: Medicare Other | Source: Ambulatory Visit

## 2020-12-06 ENCOUNTER — Ambulatory Visit
Admission: RE | Admit: 2020-12-06 | Discharge: 2020-12-06 | Disposition: A | Discharge status: Home / Self Care | Payer: Medicare Other | Source: Ambulatory Visit | Attending: Family Medicine | Admitting: Family Medicine

## 2020-12-06 ENCOUNTER — Other Ambulatory Visit: Payer: Self-pay

## 2020-12-06 DIAGNOSIS — Z1231 Encounter for screening mammogram for malignant neoplasm of breast: Secondary | ICD-10-CM

## 2020-12-22 ENCOUNTER — Telehealth: Payer: Self-pay | Admitting: *Deleted

## 2020-12-22 NOTE — Telephone Encounter (Signed)
Returned call back to patient, no answer, left vmessage per doctor concerning colchicine.

## 2020-12-22 NOTE — Telephone Encounter (Signed)
Patient is calling to request a generic or replacement of the colchicine, keeps going up in price. Please advise.

## 2021-01-25 DIAGNOSIS — I10 Essential (primary) hypertension: Secondary | ICD-10-CM | POA: Diagnosis not present

## 2021-01-25 DIAGNOSIS — L255 Unspecified contact dermatitis due to plants, except food: Secondary | ICD-10-CM | POA: Diagnosis not present

## 2021-01-27 ENCOUNTER — Other Ambulatory Visit: Payer: Self-pay

## 2021-01-27 ENCOUNTER — Emergency Department (HOSPITAL_COMMUNITY)
Admission: EM | Admit: 2021-01-27 | Discharge: 2021-01-27 | Disposition: A | Discharge status: Home / Self Care | Payer: Medicare Other | Attending: Emergency Medicine | Admitting: Emergency Medicine

## 2021-01-27 ENCOUNTER — Emergency Department (HOSPITAL_COMMUNITY): Payer: Medicare Other

## 2021-01-27 DIAGNOSIS — Z79899 Other long term (current) drug therapy: Secondary | ICD-10-CM | POA: Insufficient documentation

## 2021-01-27 DIAGNOSIS — W108XXA Fall (on) (from) other stairs and steps, initial encounter: Secondary | ICD-10-CM | POA: Insufficient documentation

## 2021-01-27 DIAGNOSIS — S01511A Laceration without foreign body of lip, initial encounter: Secondary | ICD-10-CM

## 2021-01-27 DIAGNOSIS — S022XXA Fracture of nasal bones, initial encounter for closed fracture: Secondary | ICD-10-CM

## 2021-01-27 DIAGNOSIS — I10 Essential (primary) hypertension: Secondary | ICD-10-CM | POA: Diagnosis not present

## 2021-01-27 DIAGNOSIS — W19XXXA Unspecified fall, initial encounter: Secondary | ICD-10-CM

## 2021-01-27 DIAGNOSIS — S0992XA Unspecified injury of nose, initial encounter: Secondary | ICD-10-CM | POA: Diagnosis present

## 2021-01-27 DIAGNOSIS — S0101XA Laceration without foreign body of scalp, initial encounter: Secondary | ICD-10-CM

## 2021-01-27 DIAGNOSIS — S0990XA Unspecified injury of head, initial encounter: Secondary | ICD-10-CM

## 2021-01-27 MED ORDER — OXYCODONE-ACETAMINOPHEN 5-325 MG PO TABS
1.0000 | ORAL_TABLET | Freq: Once | ORAL | Status: AC
  Administered 2021-01-27: 1 via ORAL
  Filled 2021-01-27: qty 1

## 2021-01-27 MED ORDER — LIDOCAINE HCL (PF) 1 % IJ SOLN
5.0000 mL | Freq: Once | INTRAMUSCULAR | Status: AC
  Administered 2021-01-27: 5 mL
  Filled 2021-01-27: qty 5

## 2021-01-27 MED ORDER — METHOCARBAMOL 500 MG PO TABS: 500.0000 mg | ORAL_TABLET | Freq: Three times a day (TID) | ORAL | 0 refills | Status: DC | PRN

## 2021-01-27 NOTE — ED Provider Notes (Signed)
[facility] EMERGENCY DEPARTMENT Provider Note   CSN: [id number] Arrival date & time: 01/27/21  1533     History Chief Complaint  Patient presents with  . Fall  . Head Injury    Melanie Kirby is a 75 y.o. female.  HPI Patient resents after a fall from attic.  Report fell down 3-4 steps.  Landed on her back and face.  Complaining of pain mostly in her face and head.  Some mild mid back pain.  No difficulty breathing.  No lightheadedness or dizziness.  Thinks her tetanus is up-to-date.  Not on blood thinners.  No vision changes.    Past Medical History:  Diagnosis Date  . High cholesterol   . Hypertension     There are no problems to display for this patient.   No past surgical history on file.   OB History   No obstetric history on file.     No family history on file.  Social History   Tobacco Use  . Smoking status: Never Smoker  . Smokeless tobacco: Never Used    Home Medications Prior to Admission medications   Medication Sig Start Date End Date Taking? Authorizing Provider  methocarbamol (ROBAXIN) 500 MG tablet Take 1 tablet (500 mg total) by mouth every 8 (eight) hours as needed for muscle spasms. 01/27/21  Yes Davonna Belling, MD  benazepril (LOTENSIN) 40 MG tablet  08/11/17   [provider]  colchicine 0.6 MG tablet Take 1 tablet TID until GI upset or flare subsides, then for maintenance, take 1 tablet daily. 09/30/19   Hyatt, Max T, DPM  colchicine 0.6 MG tablet Take 1 tablet (0.6 mg total) by mouth daily. 08/31/20   Hyatt, Max T, DPM  indomethacin (INDOCIN) 25 MG capsule Take 1 capsule (25 mg total) by mouth 3 (three) times daily with meals. 09/13/17   Hyatt, Max T, DPM  methylPREDNISolone (MEDROL DOSEPAK) 4 MG TBPK tablet 6 day dose pack - take as directed 09/30/19   Hyatt, Max T, DPM  Multiple Vitamin (MULTIVITAMIN) capsule Take 1 capsule by mouth daily.    [provider]  Omega-3 Fatty Acids (FISH OIL PO)  Take by mouth.    [provider]  simvastatin (ZOCOR) 40 MG tablet  08/25/17   [provider]    Allergies    Allopurinol and Other  Review of Systems   Review of Systems  Constitutional: Negative for activity change and fever.  HENT: Positive for facial swelling. Negative for mouth sores and trouble swallowing.   Respiratory: Negative for shortness of breath.   Cardiovascular: Negative for chest pain.  Gastrointestinal: Negative for abdominal pain.  Endocrine: Negative for polyuria.  Genitourinary: Negative for flank pain.  Musculoskeletal: Positive for back pain.  Skin: Positive for wound. Negative for rash.  Neurological: Positive for headaches.  Psychiatric/Behavioral: Negative for confusion.    Physical Exam Updated Vital Signs BP (!) 154/75   Pulse (!) 56   Temp 97.7 F (36.5 C) (Oral)   Resp 10   Ht 5\' 5"  (1.651 m)   Wt 76.2 kg   SpO2 99%   BMI 27.96 kg/m   Physical Exam Vitals and nursing note reviewed.  HENT:     Head:     Comments: Hematoma with approximately 7 cm laceration horizontally of upon left side of scalp.   Also approximately 1 cm through and through vertical wound on the midline directly below the nose.  No involvement of vermilion border.  Nose:     Comments: Tenderness over bridge of nose with some swelling. Eyes:     Extraocular Movements: Extraocular movements intact.     Pupils: Pupils are equal, round, and reactive to light.  Cardiovascular:     Rate and Rhythm: Normal rate.  Chest:     Chest wall: No tenderness.  Abdominal:     Tenderness: There is no abdominal tenderness.  Musculoskeletal:        General: No tenderness.     Cervical back: Neck supple.     Comments: no cervical thoracic or lumbar spine tenderness.  No back tenderness.  Skin:    General: Skin is warm.     Capillary Refill: Capillary refill takes less than 2 seconds.  Neurological:     Mental Status: She is alert and oriented to person, place,  and time.     ED Results / Procedures / Treatments   Labs (all labs ordered are listed, but only abnormal results are displayed) Labs Reviewed - No data to display  EKG None  Radiology CT Head Wo Contrast  Result Date: 01/27/2021 CLINICAL DATA:  Head trauma.  Fall. EXAM: CT HEAD WITHOUT CONTRAST CT MAXILLOFACIAL WITHOUT CONTRAST TECHNIQUE: Multidetector CT imaging of the head and maxillofacial structures were performed using the standard protocol without intravenous contrast. Multiplanar CT image reconstructions of the maxillofacial structures were also generated. COMPARISON:  None. FINDINGS: CT HEAD FINDINGS Brain: No evidence of acute infarction, hemorrhage, hydrocephalus, or extra-axial fluid collection. Similar size/appearance of a 11 mm discrete hypodensity in the inferior right basal ganglia. Moderate patchy white matter hypoattenuation, most likely related to chronic microvascular disease. Vascular: No hyperdense vessel identified. Calcific atherosclerosis. Skull: Similar small osteoma arising from the outer table of the right paramidline frontal calvarium. Contusion at the vertex and involving the midline frontal scalp without acute calvarial fracture Other: No mastoid effusions. CT MAXILLOFACIAL FINDINGS Osseous: Bilateral nasal bone fractures, comminuted and mildly displaced on the left. Nondisplaced fracture of the anterior maxillary spine. Bilateral TMJ joints are located. Orbits: Negative. No traumatic or inflammatory finding. Sinuses: Mild scattered ethmoid air cell mucosal thickening and opacification. Small amount of fluid layering in the left maxillary sinus and right sphenoid sinus. Rightward nasal septal deviation with bony spur. Soft tissues: Frontal midline scalp contusion/laceration. Nasal contusion. IMPRESSION: CT head: 1. No evidence of acute intracranial abnormality.Midline frontal scalp and vertex contusions/lacerations without acute calvarial fracture. 2. Similar 11 mm  hypodensity in the inferior right basal ganglia, most likely a dilated perivascular space (favored), benign cyst, or remote infarct. 3. Moderate chronic microvascular ischemic disease. CT maxillofacial: 1. Bilateral nasal bone fractures, comminuted and mildly displaced on the left. Overlying nasal contusion. 2. Nondisplaced fracture of the anterior maxillary spine. Electronically Signed   By: Margaretha Sheffield MD   On: 01/27/2021 17:21   CT Maxillofacial Wo Contrast  Result Date: 01/27/2021 CLINICAL DATA:  Head trauma.  Fall. EXAM: CT HEAD WITHOUT CONTRAST CT MAXILLOFACIAL WITHOUT CONTRAST TECHNIQUE: Multidetector CT imaging of the head and maxillofacial structures were performed using the standard protocol without intravenous contrast. Multiplanar CT image reconstructions of the maxillofacial structures were also generated. COMPARISON:  None. FINDINGS: CT HEAD FINDINGS Brain: No evidence of acute infarction, hemorrhage, hydrocephalus, or extra-axial fluid collection. Similar size/appearance of a 11 mm discrete hypodensity in the inferior right basal ganglia. Moderate patchy white matter hypoattenuation, most likely related to chronic microvascular disease. Vascular: No hyperdense vessel identified. Calcific atherosclerosis. Skull: Similar small osteoma arising from the outer table  of the right paramidline frontal calvarium. Contusion at the vertex and involving the midline frontal scalp without acute calvarial fracture Other: No mastoid effusions. CT MAXILLOFACIAL FINDINGS Osseous: Bilateral nasal bone fractures, comminuted and mildly displaced on the left. Nondisplaced fracture of the anterior maxillary spine. Bilateral TMJ joints are located. Orbits: Negative. No traumatic or inflammatory finding. Sinuses: Mild scattered ethmoid air cell mucosal thickening and opacification. Small amount of fluid layering in the left maxillary sinus and right sphenoid sinus. Rightward nasal septal deviation with bony spur.  Soft tissues: Frontal midline scalp contusion/laceration. Nasal contusion. IMPRESSION: CT head: 1. No evidence of acute intracranial abnormality.Midline frontal scalp and vertex contusions/lacerations without acute calvarial fracture. 2. Similar 11 mm hypodensity in the inferior right basal ganglia, most likely a dilated perivascular space (favored), benign cyst, or remote infarct. 3. Moderate chronic microvascular ischemic disease. CT maxillofacial: 1. Bilateral nasal bone fractures, comminuted and mildly displaced on the left. Overlying nasal contusion. 2. Nondisplaced fracture of the anterior maxillary spine. Electronically Signed   By: Margaretha Sheffield MD   On: 01/27/2021 17:21    Procedures .Marland KitchenLaceration Repair  Date/Time: 01/27/2021 8:00 PM Performed by: Davonna Belling, MD Authorized by: Davonna Belling, MD   Consent:    Consent obtained:  Verbal   Consent given by:  Patient   Risks, benefits, and alternatives were discussed: yes     Risks discussed:  Infection   Alternatives discussed:  No treatment and delayed treatment Anesthesia:    Anesthesia method:  Local infiltration   Local anesthetic:  Lidocaine 1% w/o epi Laceration details:    Location:  Scalp   Scalp location:  Frontal   Length (cm):  7 Pre-procedure details:    Preparation:  Patient was prepped and draped in usual sterile fashion and imaging obtained to evaluate for foreign bodies Exploration:    Limited defect created (wound extended): no     Wound exploration: wound explored through full range of motion     Contaminated: no   Treatment:    Area cleansed with:  Saline   Amount of cleaning:  Standard   Irrigation method:  Syringe   Debridement:  None Skin repair:    Repair method:  Sutures   Suture size:  4-0   Suture material:  Prolene   Suture technique:  Simple interrupted   Number of sutures:  10 Approximation:    Approximation:  Close Repair type:    Repair type:  Simple Post-procedure details:     Dressing:  Sterile dressing   Procedure completion:  Tolerated well, no immediate complications .Marland KitchenLaceration Repair  Date/Time: 01/27/2021 8:00 PM Performed by: Davonna Belling, MD Authorized by: Davonna Belling, MD   Consent:    Consent obtained:  Verbal   Consent given by:  Patient   Alternatives discussed:  No treatment Universal protocol:    Procedure explained and questions answered to patient or proxy's satisfaction: yes   Anesthesia:    Anesthesia method:  Local infiltration   Local anesthetic:  Lidocaine 1% w/o epi Laceration details:    Location:  Lip   Lip location:  Upper lip, full thickness   Vermilion border involved: no     Length (cm):  1.5 Pre-procedure details:    Preparation:  Patient was prepped and draped in usual sterile fashion Exploration:    Limited defect created (wound extended): no     Contaminated: no   Treatment:    Area cleansed with:  Saline   Debridement:  None   Layers/structures  repaired:  Deep subcutaneous Deep subcutaneous:    Suture size:  5-0   Suture material:  Vicryl   Number of sutures:  3 Skin repair:    Repair method:  Sutures   Suture size:  4-0   Suture material:  Prolene   Number of sutures:  8 Approximation:    Approximation:  Close Repair type:    Repair type:  Simple Post-procedure details:    Dressing:  Sterile dressing   Procedure completion:  Tolerated well, no immediate complications     Medications Ordered in ED Medications  lidocaine (PF) (XYLOCAINE) 1 % injection 5 mL (5 mLs Infiltration Given 01/27/21 1708)  lidocaine (PF) (XYLOCAINE) 1 % injection 5 mL (5 mLs Infiltration Given 01/27/21 1916)  oxyCODONE-acetaminophen (PERCOCET/ROXICET) 5-325 MG per tablet 1 tablet (1 tablet Oral Given 01/27/21 1916)    ED Course  I have reviewed the triage vital signs and the nursing notes.  Pertinent labs & imaging results that were available during my care of the patient were reviewed by me and considered in my  medical decision making (see chart for details).    MDM Rules/Calculators/A&P                          *Patient with fall.  Mechanical.  Hit face.  No other apparent injury lower than the head.  Does not appear to need cervical spine imaging.  Does have laceration to head and low nose.  Both wounds closed.  Also nasal fracture.  Will have patient follow-up with plastic surgery for both the laceration and the nasal fracture.  Discharge home Final Clinical Impression(s) / ED Diagnoses Final diagnoses:  Fall, initial encounter  Injury of head, initial encounter  Laceration of scalp, initial encounter  Lip laceration, initial encounter  Closed fracture of nasal bone, initial encounter    Rx / DC Orders ED Discharge Orders         Ordered    methocarbamol (ROBAXIN) 500 MG tablet  Every 8 hours PRN        01/27/21 1945           Davonna Belling, MD 01/28/21 [phone]

## 2021-01-27 NOTE — ED Triage Notes (Signed)
Patient presents to the ED after falling from the stairs coming from the attic.  Patient states that she thinks she fell 3 to 4 feet.  Denies loss of consciousness.  Patient has a laceration to the head and between the nose and mouth.  Patient states that her back is sore.

## 2021-02-02 DIAGNOSIS — S0181XA Laceration without foreign body of other part of head, initial encounter: Secondary | ICD-10-CM | POA: Diagnosis not present

## 2021-05-17 DIAGNOSIS — M8580 Other specified disorders of bone density and structure, unspecified site: Secondary | ICD-10-CM | POA: Diagnosis not present

## 2021-05-17 DIAGNOSIS — R809 Proteinuria, unspecified: Secondary | ICD-10-CM | POA: Diagnosis not present

## 2021-05-17 DIAGNOSIS — I10 Essential (primary) hypertension: Secondary | ICD-10-CM | POA: Diagnosis not present

## 2021-05-17 DIAGNOSIS — K219 Gastro-esophageal reflux disease without esophagitis: Secondary | ICD-10-CM | POA: Diagnosis not present

## 2021-05-17 DIAGNOSIS — E7800 Pure hypercholesterolemia, unspecified: Secondary | ICD-10-CM | POA: Diagnosis not present

## 2021-07-19 DIAGNOSIS — K219 Gastro-esophageal reflux disease without esophagitis: Secondary | ICD-10-CM | POA: Diagnosis not present

## 2021-07-19 DIAGNOSIS — N1832 Chronic kidney disease, stage 3b: Secondary | ICD-10-CM | POA: Diagnosis not present

## 2021-07-27 DIAGNOSIS — H52223 Regular astigmatism, bilateral: Secondary | ICD-10-CM | POA: Diagnosis not present

## 2021-07-27 DIAGNOSIS — H5213 Myopia, bilateral: Secondary | ICD-10-CM | POA: Diagnosis not present

## 2021-07-27 DIAGNOSIS — H25813 Combined forms of age-related cataract, bilateral: Secondary | ICD-10-CM | POA: Diagnosis not present

## 2021-07-27 DIAGNOSIS — H524 Presbyopia: Secondary | ICD-10-CM | POA: Diagnosis not present

## 2021-08-03 DIAGNOSIS — K649 Unspecified hemorrhoids: Secondary | ICD-10-CM | POA: Diagnosis not present

## 2021-08-03 DIAGNOSIS — D125 Benign neoplasm of sigmoid colon: Secondary | ICD-10-CM | POA: Diagnosis not present

## 2021-08-03 DIAGNOSIS — Z86010 Personal history of colonic polyps: Secondary | ICD-10-CM | POA: Diagnosis not present

## 2021-08-03 DIAGNOSIS — K5730 Diverticulosis of large intestine without perforation or abscess without bleeding: Secondary | ICD-10-CM | POA: Diagnosis not present

## 2021-08-05 DIAGNOSIS — D125 Benign neoplasm of sigmoid colon: Secondary | ICD-10-CM | POA: Diagnosis not present

## 2021-09-06 ENCOUNTER — Other Ambulatory Visit: Payer: Self-pay

## 2021-09-06 ENCOUNTER — Ambulatory Visit: Payer: Medicare Other | Admitting: Podiatry

## 2021-09-06 ENCOUNTER — Encounter: Payer: Self-pay | Admitting: Podiatry

## 2021-09-06 DIAGNOSIS — M109 Gout, unspecified: Secondary | ICD-10-CM | POA: Diagnosis not present

## 2021-09-06 DIAGNOSIS — M79676 Pain in unspecified toe(s): Secondary | ICD-10-CM | POA: Diagnosis not present

## 2021-09-06 DIAGNOSIS — B351 Tinea unguium: Secondary | ICD-10-CM

## 2021-09-06 MED ORDER — COLCHICINE 0.6 MG PO TABS: ORAL_TABLET | ORAL | 3 refills | Status: DC

## 2021-09-06 NOTE — Progress Notes (Signed)
She presents today states that she has happy feet no problems whatsoever has not had any gout attacks states that get a little tender occasionally but all in all she continues to take her colchicine on a daily basis without any complications.  Objective: Vital signs are stable she is alert and oriented x3 there is no erythema edema cellulitis drainage or odor she still has osteoarthritic changes most likely secondary to seropositive arthropathy at the time around the first metatarsophalangeal joints and some degree in the toes.  All in all she looks very good in comparison to her previous outbreaks.  Toenails are long thick yellow painful some subungual debris.  Assessment: Well-controlled gouty capsulitis.  Toenails are long thick discolored and painful  Plan: Discussed etiology pathology conservative surgical therapies at this point I highly recommended she continue the colchicine and we called her in a years supply this I also debrided her nails 1 through 5 bilateral.

## 2021-10-31 ENCOUNTER — Other Ambulatory Visit: Payer: Self-pay | Admitting: Family Medicine

## 2021-10-31 DIAGNOSIS — Z1231 Encounter for screening mammogram for malignant neoplasm of breast: Secondary | ICD-10-CM

## 2021-11-14 ENCOUNTER — Other Ambulatory Visit: Payer: Self-pay | Admitting: Family Medicine

## 2021-11-14 DIAGNOSIS — M8588 Other specified disorders of bone density and structure, other site: Secondary | ICD-10-CM

## 2021-11-14 DIAGNOSIS — E7800 Pure hypercholesterolemia, unspecified: Secondary | ICD-10-CM | POA: Diagnosis not present

## 2021-11-14 DIAGNOSIS — M8580 Other specified disorders of bone density and structure, unspecified site: Secondary | ICD-10-CM | POA: Diagnosis not present

## 2021-11-14 DIAGNOSIS — N183 Chronic kidney disease, stage 3 unspecified: Secondary | ICD-10-CM | POA: Diagnosis not present

## 2021-11-14 DIAGNOSIS — Z0000 Encounter for general adult medical examination without abnormal findings: Secondary | ICD-10-CM | POA: Diagnosis not present

## 2021-11-14 DIAGNOSIS — I10 Essential (primary) hypertension: Secondary | ICD-10-CM | POA: Diagnosis not present

## 2021-11-14 DIAGNOSIS — M109 Gout, unspecified: Secondary | ICD-10-CM | POA: Diagnosis not present

## 2021-12-07 ENCOUNTER — Ambulatory Visit
Admission: RE | Admit: 2021-12-07 | Discharge: 2021-12-07 | Disposition: A | Discharge status: Home / Self Care | Payer: Medicare Other | Source: Ambulatory Visit | Attending: Family Medicine | Admitting: Family Medicine

## 2021-12-07 DIAGNOSIS — Z1231 Encounter for screening mammogram for malignant neoplasm of breast: Secondary | ICD-10-CM | POA: Diagnosis not present

## 2021-12-09 ENCOUNTER — Other Ambulatory Visit: Payer: Self-pay | Admitting: Family Medicine

## 2021-12-09 DIAGNOSIS — R928 Other abnormal and inconclusive findings on diagnostic imaging of breast: Secondary | ICD-10-CM

## 2021-12-19 ENCOUNTER — Other Ambulatory Visit: Payer: Self-pay | Admitting: Family Medicine

## 2021-12-19 ENCOUNTER — Ambulatory Visit
Admission: RE | Admit: 2021-12-19 | Discharge: 2021-12-19 | Disposition: A | Discharge status: Home / Self Care | Payer: Medicare Other | Source: Ambulatory Visit | Attending: Family Medicine | Admitting: Family Medicine

## 2021-12-19 DIAGNOSIS — R928 Other abnormal and inconclusive findings on diagnostic imaging of breast: Secondary | ICD-10-CM

## 2021-12-19 DIAGNOSIS — N6321 Unspecified lump in the left breast, upper outer quadrant: Secondary | ICD-10-CM

## 2021-12-19 DIAGNOSIS — N6314 Unspecified lump in the right breast, lower inner quadrant: Secondary | ICD-10-CM | POA: Diagnosis not present

## 2021-12-19 DIAGNOSIS — R922 Inconclusive mammogram: Secondary | ICD-10-CM | POA: Diagnosis not present

## 2021-12-23 ENCOUNTER — Ambulatory Visit
Admission: RE | Admit: 2021-12-23 | Discharge: 2021-12-23 | Disposition: A | Discharge status: Home / Self Care | Payer: Medicare Other | Source: Ambulatory Visit | Attending: Family Medicine | Admitting: Family Medicine

## 2021-12-23 DIAGNOSIS — C50311 Malignant neoplasm of lower-inner quadrant of right female breast: Secondary | ICD-10-CM | POA: Diagnosis not present

## 2021-12-23 DIAGNOSIS — N6321 Unspecified lump in the left breast, upper outer quadrant: Secondary | ICD-10-CM

## 2021-12-27 ENCOUNTER — Telehealth: Payer: Self-pay | Admitting: Hematology and Oncology

## 2021-12-27 NOTE — Telephone Encounter (Signed)
Spoke to patient to confirm afternoon clinic appointment for 5/10, packet will be mailed ?

## 2022-01-01 NOTE — Progress Notes (Signed)
?Radiation Oncology         [phone] ?________________________________ ? ?Name: Melanie Kirby        MRN: [id number]  ?Date of Service: 01/04/2022 DOB: 1945-10-06 ? ?OE:UMPNTIRW, L.Marlou Sa, MD  [name], MD    ? ?REFERRING PHYSICIAN: [name], MD ? ? ?DIAGNOSIS: The encounter diagnosis was Malignant neoplasm of lower-inner quadrant of right breast of female, estrogen receptor positive ([place]).  ? ? ?HISTORY OF PRESENT ILLNESS: Melanie Kirby is a 76 y.o. female seen in the multidisciplinary breast clinic for a new diagnosis of right breast cancer. The patient was noted to have a screening detected mass in the right breast.  This was seen in the lower inner quadrant in the 4 o'clock position measuring 7 mm by diagnostic ultrasound.  Her axilla was negative for adenopathy.  Biopsy showed grade 2 invasive ductal carcinoma that was ER/PR positive, HER2/neu is equivocal by IHC and FISH is pending.  Ki-67 is less than 5%.  She is seen to discuss treatment recommendations of her cancer. ? ?Patient accompanied by her husband. Given her age and disease characteristics, patient currently interested in omitting radiation.  ? ?PREVIOUS RADIATION THERAPY: No ? ? ?PAST MEDICAL HISTORY:  ?Past Medical History:  ?Diagnosis Date  ? High cholesterol   ? Hypertension   ?   ? ? ?PAST SURGICAL HISTORY:No past surgical history on file. ? ? ?FAMILY HISTORY: No family history on file. ? ? ?SOCIAL HISTORY:  reports that she has never smoked. She has never used smokeless tobacco. The patient is married and lives in [place].  ? ? ?ALLERGIES: Allopurinol, Other, and Pravachol [pravastatin] ? ? ?MEDICATIONS:  ?Current Outpatient Medications  ?Medication Sig Dispense Refill  ? benazepril (LOTENSIN) 40 MG tablet     ? colchicine 0.6 MG tablet Take one capsule daily for maintenance or as needed for gout flares 90 tablet 3  ? FLUZONE HIGH-DOSE QUADRIVALENT 0.7 ML SUSY     ? indomethacin (INDOCIN) 25 MG  capsule Take 1 capsule (25 mg total) by mouth 3 (three) times daily with meals. 90 capsule 1  ? methocarbamol (ROBAXIN) 500 MG tablet Take 1 tablet (500 mg total) by mouth every 8 (eight) hours as needed for muscle spasms. 8 tablet 0  ? Multiple Vitamin (MULTIVITAMIN) capsule Take 1 capsule by mouth daily.    ? Omega-3 Fatty Acids (FISH OIL PO) Take by mouth.    ? simvastatin (ZOCOR) 40 MG tablet     ? ?No current facility-administered medications for this encounter.  ? ? ? ?REVIEW OF SYSTEMS: On review of systems, the patient reports that she is doing well overall without systemic symptoms or major concerns. ? ?  ? ?PHYSICAL EXAM:  ?Wt Readings from Last 3 Encounters:  ?01/27/21 168 lb (76.2 kg)  ?06/02/14 160 lb (72.6 kg)  ? ?Temp Readings from Last 3 Encounters:  ?01/27/21 97.7 ?F (36.5 ?C) (Oral)  ? ?BP Readings from Last 3 Encounters:  ?01/27/21 (!) 154/75  ?07/07/14 135/68  ?06/02/14 (!) 147/88  ? ?Pulse Readings from Last 3 Encounters:  ?01/27/21 (!) 56  ?07/07/14 68  ?06/02/14 62  ? ? ?In general this is a well appearing caucasian female in no acute distress. She's alert and oriented x4 and appropriate throughout the examination. Cardiopulmonary assessment is negative for acute distress and she exhibits normal effort. Bilateral breast exam is deferred. ? ?ECOG = 0 ? ?0 - Asymptomatic (Fully active, able to carry on all predisease activities without restriction) ? ?  1 - Symptomatic but completely ambulatory (Restricted in physically strenuous activity but ambulatory and able to carry out work of a light or sedentary nature. For example, light housework, office work) ? ?2 - Symptomatic, <50% in bed during the day (Ambulatory and capable of all self care but unable to carry out any work activities. Up and about more than 50% of waking hours) ? ?3 - Symptomatic, >50% in bed, but not bedbound (Capable of only limited self-care, confined to bed or chair 50% or more of waking hours) ? ?4 - Bedbound (Completely  disabled. Cannot carry on any self-care. Totally confined to bed or chair) ? ?5 - Death ? ? Oken MM, Creech RH, Tormey DC, et al. [phone]). "Toxicity and response criteria of the [facility]". [place] Oncol. 5 (6): 649-55 ? ? ? ?LABORATORY DATA:  ?Lab Results  ?Component Value Date  ? WBC 10.1 01/04/2022  ? HGB 15.8 (H) 01/04/2022  ? HCT 46.3 (H) 01/04/2022  ? MCV 99.1 01/04/2022  ? PLT 269 01/04/2022  ? ?No results found for: NA, K, CL, CO2 ?No results found for: ALT, AST, GGT, ALKPHOS, BILITOT ?  ? ?RADIOGRAPHY: US BREAST LTD UNI RIGHT INC AXILLA ? ?Result Date: 12/19/2021 ?CLINICAL DATA:  Screening recall for a possible right breast mass. EXAM: DIGITAL DIAGNOSTIC UNILATERAL RIGHT MAMMOGRAM WITH TOMOSYNTHESIS AND CAD; ULTRASOUND RIGHT BREAST LIMITED TECHNIQUE: Right digital diagnostic mammography and breast tomosynthesis was performed. The images were evaluated with computer-aided detection.; Targeted ultrasound examination of the right breast was performed COMPARISON:  Previous exam(s). ACR Breast Density Category c: The breast tissue is heterogeneously dense, which may obscure small masses. FINDINGS: Spot compression tomosynthesis images through the lower inner right breast demonstrates an irregular mass with spiculated margins measuring approximately 1 cm. Ultrasound targeted to the right breast at 4 o'clock, 3 cm from the nipple demonstrates a small hypoechoic shadowing mass with indistinct margins measuring 7 x 5 x 5 mm. Ultrasound of the right axilla demonstrates multiple normal-appearing lymph nodes. IMPRESSION: 1. There is a suspicious 7 mm mass in the right breast at 4 o'clock. 2.  No evidence of right axillary lymphadenopathy. RECOMMENDATION: Ultrasound guided biopsy is recommended for the right breast mass. This has been scheduled for 12/23/2021 at 2:45 p.m. I have discussed the findings and recommendations with the patient. If applicable, a reminder letter will be sent to the  patient regarding the next appointment. BI-RADS CATEGORY  5: Highly suggestive of malignancy. Electronically Signed   By: Ammie Ferrier M.D.   On: 12/19/2021 15:31 ? ?MM DIAG BREAST TOMO UNI RIGHT ? ?Result Date: 12/19/2021 ?CLINICAL DATA:  Screening recall for a possible right breast mass. EXAM: DIGITAL DIAGNOSTIC UNILATERAL RIGHT MAMMOGRAM WITH TOMOSYNTHESIS AND CAD; ULTRASOUND RIGHT BREAST LIMITED TECHNIQUE: Right digital diagnostic mammography and breast tomosynthesis was performed. The images were evaluated with computer-aided detection.; Targeted ultrasound examination of the right breast was performed COMPARISON:  Previous exam(s). ACR Breast Density Category c: The breast tissue is heterogeneously dense, which may obscure small masses. FINDINGS: Spot compression tomosynthesis images through the lower inner right breast demonstrates an irregular mass with spiculated margins measuring approximately 1 cm. Ultrasound targeted to the right breast at 4 o'clock, 3 cm from the nipple demonstrates a small hypoechoic shadowing mass with indistinct margins measuring 7 x 5 x 5 mm. Ultrasound of the right axilla demonstrates multiple normal-appearing lymph nodes. IMPRESSION: 1. There is a suspicious 7 mm mass in the right breast at 4 o'clock. 2.  No evidence of right axillary lymphadenopathy. RECOMMENDATION: Ultrasound guided biopsy is recommended for the right breast mass. This has been scheduled for 12/23/2021 at 2:45 p.m. I have discussed the findings and recommendations with the patient. If applicable, a reminder letter will be sent to the patient regarding the next appointment. BI-RADS CATEGORY  5: Highly suggestive of malignancy. Electronically Signed   By: Ammie Ferrier M.D.   On: 12/19/2021 15:31 ? ?MM 3D SCREEN BREAST BILATERAL ? ?Result Date: 12/08/2021 ?CLINICAL DATA:  Screening. EXAM: DIGITAL SCREENING BILATERAL MAMMOGRAM WITH TOMOSYNTHESIS AND CAD TECHNIQUE: Bilateral screening digital craniocaudal and  mediolateral oblique mammograms were obtained. Bilateral screening digital breast tomosynthesis was performed. The images were evaluated with computer-aided detection. COMPARISON:  Previous exam(s). ACR Breas

## 2022-01-02 ENCOUNTER — Encounter: Payer: Self-pay | Admitting: *Deleted

## 2022-01-02 DIAGNOSIS — C50311 Malignant neoplasm of lower-inner quadrant of right female breast: Secondary | ICD-10-CM | POA: Insufficient documentation

## 2022-01-04 ENCOUNTER — Encounter: Payer: Self-pay | Admitting: Genetic Counselor

## 2022-01-04 ENCOUNTER — Encounter: Payer: Self-pay | Admitting: *Deleted

## 2022-01-04 ENCOUNTER — Inpatient Hospital Stay: Payer: Medicare Other

## 2022-01-04 ENCOUNTER — Encounter: Payer: Self-pay | Admitting: General Practice

## 2022-01-04 ENCOUNTER — Inpatient Hospital Stay (HOSPITAL_BASED_OUTPATIENT_CLINIC_OR_DEPARTMENT_OTHER): Payer: Medicare Other | Admitting: Genetic Counselor

## 2022-01-04 ENCOUNTER — Inpatient Hospital Stay: Payer: Medicare Other | Attending: Hematology and Oncology

## 2022-01-04 ENCOUNTER — Other Ambulatory Visit: Payer: Self-pay | Admitting: Surgery

## 2022-01-04 ENCOUNTER — Other Ambulatory Visit: Payer: Self-pay

## 2022-01-04 ENCOUNTER — Inpatient Hospital Stay (HOSPITAL_BASED_OUTPATIENT_CLINIC_OR_DEPARTMENT_OTHER): Payer: Medicare Other | Admitting: Hematology and Oncology

## 2022-01-04 ENCOUNTER — Ambulatory Visit: Payer: Medicare Other | Admitting: Physical Therapy

## 2022-01-04 ENCOUNTER — Ambulatory Visit
Admission: RE | Admit: 2022-01-04 | Discharge: 2022-01-04 | Disposition: A | Discharge status: Home / Self Care | Payer: Medicare Other | Source: Ambulatory Visit | Attending: Radiation Oncology | Admitting: Radiation Oncology

## 2022-01-04 ENCOUNTER — Encounter: Payer: Self-pay | Admitting: Emergency Medicine

## 2022-01-04 DIAGNOSIS — Z801 Family history of malignant neoplasm of trachea, bronchus and lung: Secondary | ICD-10-CM

## 2022-01-04 DIAGNOSIS — Z808 Family history of malignant neoplasm of other organs or systems: Secondary | ICD-10-CM

## 2022-01-04 DIAGNOSIS — Z170 Estrogen receptor positive status [ER+]: Secondary | ICD-10-CM | POA: Insufficient documentation

## 2022-01-04 DIAGNOSIS — Z79899 Other long term (current) drug therapy: Secondary | ICD-10-CM | POA: Insufficient documentation

## 2022-01-04 DIAGNOSIS — Z803 Family history of malignant neoplasm of breast: Secondary | ICD-10-CM

## 2022-01-04 DIAGNOSIS — K219 Gastro-esophageal reflux disease without esophagitis: Secondary | ICD-10-CM | POA: Insufficient documentation

## 2022-01-04 DIAGNOSIS — C50311 Malignant neoplasm of lower-inner quadrant of right female breast: Secondary | ICD-10-CM | POA: Diagnosis not present

## 2022-01-04 DIAGNOSIS — Z853 Personal history of malignant neoplasm of breast: Secondary | ICD-10-CM

## 2022-01-04 DIAGNOSIS — C50911 Malignant neoplasm of unspecified site of right female breast: Secondary | ICD-10-CM | POA: Diagnosis not present

## 2022-01-04 DIAGNOSIS — Z006 Encounter for examination for normal comparison and control in clinical research program: Secondary | ICD-10-CM

## 2022-01-04 DIAGNOSIS — K5730 Diverticulosis of large intestine without perforation or abscess without bleeding: Secondary | ICD-10-CM | POA: Insufficient documentation

## 2022-01-04 DIAGNOSIS — E7800 Pure hypercholesterolemia, unspecified: Secondary | ICD-10-CM | POA: Insufficient documentation

## 2022-01-04 DIAGNOSIS — N1832 Chronic kidney disease, stage 3b: Secondary | ICD-10-CM | POA: Insufficient documentation

## 2022-01-04 HISTORY — DX: Family history of malignant neoplasm of breast: Z80.3

## 2022-01-04 HISTORY — DX: Family history of malignant neoplasm of other organs or systems: Z80.8

## 2022-01-04 LAB — CMP (CANCER CENTER ONLY)
ALT: 21 U/L (ref 0–44)
AST: 21 U/L (ref 15–41)
Albumin: 4.4 g/dL (ref 3.5–5.0)
Alkaline Phosphatase: 81 U/L (ref 38–126)
Anion gap: 7 (ref 5–15)
BUN: 25 mg/dL — ABNORMAL HIGH (ref 8–23)
CO2: 29 mmol/L (ref 22–32)
Calcium: 10 mg/dL (ref 8.9–10.3)
Chloride: 104 mmol/L (ref 98–111)
Creatinine: 1.35 mg/dL — ABNORMAL HIGH (ref 0.44–1.00)
GFR, Estimated: 41 mL/min — ABNORMAL LOW (ref >60)
Glucose, Bld: 94 mg/dL (ref 70–99)
Potassium: 4.5 mmol/L (ref 3.5–5.1)
Sodium: 140 mmol/L (ref 135–145)
Total Bilirubin: 0.7 mg/dL (ref 0.3–1.2)
Total Protein: 7.8 g/dL (ref 6.5–8.1)

## 2022-01-04 LAB — CBC WITH DIFFERENTIAL (CANCER CENTER ONLY)
Abs Immature Granulocytes: 0.04 10*3/uL (ref 0.00–0.07)
Basophils Absolute: 0.1 10*3/uL (ref 0.0–0.1)
Basophils Relative: 1 %
Eosinophils Absolute: 0.4 10*3/uL (ref 0.0–0.5)
Eosinophils Relative: 4 %
HCT: 46.3 % — ABNORMAL HIGH (ref 36.0–46.0)
Hemoglobin: 15.8 g/dL — ABNORMAL HIGH (ref 12.0–15.0)
Immature Granulocytes: 0 %
Lymphocytes Relative: 37 %
Lymphs Abs: 3.7 10*3/uL (ref 0.7–4.0)
MCH: 33.8 pg (ref 26.0–34.0)
MCHC: 34.1 g/dL (ref 30.0–36.0)
MCV: 99.1 fL (ref 80.0–100.0)
Monocytes Absolute: 0.9 10*3/uL (ref 0.1–1.0)
Monocytes Relative: 9 %
Neutro Abs: 5 10*3/uL (ref 1.7–7.7)
Neutrophils Relative %: 49 %
Platelet Count: 269 10*3/uL (ref 150–400)
RBC: 4.67 MIL/uL (ref 3.87–5.11)
RDW: 11.9 % (ref 11.5–15.5)
WBC Count: 10.1 10*3/uL (ref 4.0–10.5)
nRBC: 0 % (ref 0.0–0.2)

## 2022-01-04 LAB — GENETIC SCREENING ORDER

## 2022-01-04 LAB — RESEARCH LABS

## 2022-01-04 NOTE — Progress Notes (Signed)
[facility] Psychosocial Distress Screening ?Spiritual Care ? ?Met with Elianny in [facility] to introduce [facility] team/resources, reviewing distress screen per protocol.  The patient scored a  0  on the Psychosocial Distress Thermometer which indicates  minimal  distress. Also assessed for distress and other psychosocial needs.  ? ? 01/04/2022  ?[facility] DISTRESS SCREENING   ?Screening Type Initial Screening   ?Distress experienced in past week (1-10) 0   ?Referral to support programs Yes   ? ?Ms Popoca reports strong support from her family and church, [employer] [facility] [address]. She reports strong faith, high confidence, and very little distress. ? ?Follow up needed: No. Per Ms Den[place], no other needs at this time, but she knows to reach out to chaplain or treatment team if needs arise or circumstances change. ? ? ?Chaplain Lorrin Jackson, MDiv, [facility] ?Pager [phone] ?Voicemail [phone] ? ? ? ? ?  ?

## 2022-01-04 NOTE — Research (Signed)
[employer] 2021-05 - Specimen Collection Study to Evaluate Biomarkers in Subjects with Cancer   ?This Nurse has reviewed this patient's inclusion and exclusion criteria as a second review and confirms [name] is eligible for study participation.  Patient may continue with enrollment.  ?Foye Spurling, BSN, RN, CCRP ?Clinical Research Nurse II ?01/04/2022 2:19 PM ? ?

## 2022-01-04 NOTE — Progress Notes (Signed)
REFERRING PROVIDER: ?Nicholas Lose, MD ?[place] ?[place],  [place] [address] ? ?PRIMARY PROVIDER:  ?Alroy Dust, L.Marlou Sa, MD ? ?PRIMARY REASON FOR VISIT:  ?1. Malignant neoplasm of lower-inner quadrant of right breast of female, estrogen receptor positive ([place])   ?2. Family history of breast cancer   ?3. Family history of melanoma   ? ? ?HISTORY OF PRESENT ILLNESS:   ?Ms. Barra, a 76 y.o. female, was seen for a [facility] cancer genetics consultation at the request of Dr. Lindi Adie due to a personal and family history of cancer.  Ms. Jr presents to clinic today to discuss the possibility of a hereditary predisposition to cancer, to discuss genetic testing, and to further clarify her future cancer risks, as well as potential cancer risks for family members.  ? ?In 2023, at the age of 53, Ms. Erney was diagnosed with invasive ductal carcinoma of the right breast (ER+/PR+/HER2-). The treatment plan is pending.  ? ?CANCER HISTORY:  ?Oncology History  ?Malignant neoplasm of lower-inner quadrant of right female breast ([place])  ?12/23/2021 Initial Diagnosis  ? Screening mammogram detected right breast mass 0.7 cm at 4 o'clock position.  Axilla negative, biopsy revealed grade 2 IDC ER 95%, PR 95%, HER2 equivocal by IHC, FISH pending, Ki-67 less than 5% ?  ? ? ?RISK FACTORS:  ?Mammogram within the last year: yes ?Colonoscopy: yes;  last year . ?Hysterectomy: yes.  ?Ovaries intact: yes.  ?Menarche was at age 35.  ?First live birth at age 43.  ?OCP use for approximately  16  years.  ? ?Past Medical History:  ?Diagnosis Date  ? Diverticulosis   ? Family history of breast cancer 01/04/2022  ? Family history of melanoma 01/04/2022  ? High cholesterol   ? Hypertension   ? ? ?Past Surgical History:  ?Procedure Laterality Date  ? ABDOMINAL HYSTERECTOMY    ? ? ?FAMILY HISTORY:  ?We obtained a detailed, 4-generation family history.  Significant diagnoses are listed below: ?Family History  ?Problem  Relation Age of Onset  ? Melanoma Sister   ?     dx 35s; metastatic  ? Lung cancer Maternal Grandfather   ? Breast cancer Niece   ?     dx 43s  ? ? ?Ms. Bialas is unaware of previous family history of genetic testing for hereditary cancer risks. There is no reported Ashkenazi Jewish ancestry. There is no known consanguinity. ? ?GENETIC COUNSELING ASSESSMENT: Ms. Brookover is a 76 y.o. female with a personal and family history of cancer which is somewhat suggestive of a hereditary cancer syndrome and predisposition to cancer given the presence of related cancers in the family. We, therefore, discussed and recommended the following at today's visit.  ? ?DISCUSSION: We discussed that 5 - 10% of cancer is hereditary.  Most cases of hereditary breast cancer are associated with mutations in BRCA1/2.  There are other genes that can be associated with hereditary breast and/or melanoma cancer syndromes.  We discussed that testing is beneficial for several reasons including knowing how to follow individuals for their cancer risks, identifying whether potential treatment options would be beneficial, and understanding if other family members could be at risk for cancer and allowing them to undergo genetic testing.  ? ?We reviewed the characteristics, features and inheritance patterns of hereditary cancer syndromes. We also discussed genetic testing, including the appropriate family members to test, the process of testing, insurance coverage and turn-around-time for results. We discussed the implications of a negative, positive, carrier and/or variant of uncertain  significant result. We recommended Ms. Ybarra pursue genetic testing for a panel that includes genes associated with breast cancer and melanoma.  ? ?Based on Ms. Weinman's personal and family history of cancer, she meets medical criteria for genetic testing. ? ?PLAN: Ms. Galentine was not interested in pursuing genetic testing at today's visit.   She feels as though there is a low chance of genetic testing impacting her medical management or medical management of family members. We understand this decision and remain available to coordinate genetic testing at any time in the future. We, therefore, recommend Ms. Searls continue to follow the cancer screening guidelines given by her primary healthcare provider. ? ?Lastly, we encouraged Ms. Warmack to remain in contact with cancer genetics annually so that we can continuously update the family history and inform her of any changes in cancer genetics and testing that may be of benefit for this family.  ? ?Ms. Borg's questions were answered to her satisfaction today. Our contact information was provided should additional questions or concerns arise. Thank you for the referral and allowing [place] to share in the care of your patient.  ? ?Zackari Ruane M. Joette Catching, [place], [place] ?Genetic Counselor ?[email or web address] ?(P) [phone] ? ?The patient was seen for a total of 10 minutes in face-to-face genetic counseling.  Patient was accompanied by her husband, Wille Glaser. Drs. Lindi Adie and/or Burr Medico were available to discuss this case as needed.  ? ?_______________________________________________________________________ ?For Office Staff:  ?Number of people involved in session: 2 ?Was an Intern/ student involved with case: no ? ?

## 2022-01-04 NOTE — Research (Signed)
[employer] 2021-05 - Specimen Collection Study to Evaluate Biomarkers in Subjects with Cancer  ? ?01/04/22 ? ?Informed Consent:  ?Patient Melanie Kirby was identified by Dr. Lindi Adie as a potential candidate for the above listed study.  This Clinical Research Coordinator met with Melanie Kirby, [id number], on 01/04/22 in a manner and location that ensures patient privacy to discuss participation in the above listed research study.  Patient is Accompanied by her husband .  A copy of the informed consent document with embedded HIPAA language was provided to the patient.  Patient reads, speaks, and understands [place]. ? ?Patient was provided with the business card of this Coordinator and encouraged to contact the research team with any questions.  Patient was provided the option of taking informed consent documents home to review and was encouraged to review at their convenience with their support network, including other care providers. Patient is comfortable with making a decision regarding study participation today. ? ?As outlined in the informed consent form, this Coordinator and Melanie Kirby discussed the purpose of the research study, the investigational nature of the study, study procedures and requirements for study participation, potential risks and benefits of study participation, as well as alternatives to participation. This study is not blinded. The patient understands participation is voluntary and they may withdraw from study participation at any time.  This study does not involve randomization.  This study does not involve an investigational drug or device. This study does not involve a placebo. Patient understands enrollment is pending full eligibility review.  ? ?Confidentiality and how the patient's information will be used as part of study participation were discussed.  Patient was informed there is reimbursement provided for their time and effort spent on  trial participation.  The patient is encouraged to discuss research study participation with their insurance provider to determine what costs they may incur as part of study participation, including research related injury.   ? ?All questions were answered to patient's satisfaction.  The informed consent with embedded HIPAA language was reviewed page by page.  The patient's mental and emotional status is appropriate to provide informed consent, and the patient verbalizes an understanding of study participation.  Patient has agreed to participate in the above listed research study and has voluntarily signed the informed consent version revised 26 Sep 2021 with embedded HIPAA language, version revised 26 Sep 2021  on 01/04/22 at 2:10 PM.  The patient was provided with a copy of the signed informed consent form with embedded HIPAA language for their reference.  No study specific procedures were obtained prior to the signing of the informed consent document.  Approximately 15 minutes were spent with the patient reviewing the informed consent documents.  Patient was not requested to complete a Release of Information form. ? ?Eligibility: Eligibility criteria reviewed with patient. This coordinator has reviewed this patient's inclusion and exclusion criteria and confirmed patient is eligible for study participation. Eligibility confirmed by treating investigator, who also agrees that patient should proceed with enrollment. ?Patient will continue with enrollment. ? ?Blood Collection: Research blood obtained by [facility] venipuncture per patient's preference. Patient tolerated well without any adverse events. ? ?Gift Card: $50 gift card given to patient for her participation in this study.  ? ?Data Collection:  ?The following data was collected from the patient today: ? ?Medical History:  ?High Blood Pressure  Yes ?Coronary Artery Disease No ?Lupus    No ?Rheumatoid Arthritis  No ?Diabetes   No      ?  Lynch Syndrome  No ? ?Is  the patient currently taking a magnesium supplement?   No ? ?Does the patient have a personal history of cancer (greater than 5 years ago)?  No ? ?Does the patient have a family history of cancer in 1st or 2nd degree relatives? Yes ?If yes, Relationship(s) and Cancer type(s)? ?The patient has history of bone cancer in her sister and lung cancer in her grandfather.   ? ?Does the patient have history of alcohol consumption? No   ? ?Does the patient have history of cigarette, cigar, pipe, or chewing tobacco use?  No  ? ?The patient was thanked for her time and participation in this study. ? ?Clabe Seal ?Clinical Research Coordinator I  ?01/04/22 2:19 PM ?

## 2022-01-04 NOTE — Assessment & Plan Note (Signed)
12/23/2021:Screening mammogram detected right breast mass 0.7 cm at 4 o'clock position.  Axilla negative, biopsy revealed grade 2 IDC ER 95%, PR 95%, HER2 equivocal by IHC, FISH pending, Ki-67 less than 5% ? ?Pathology and radiology counseling: Discussed with the patient, the details of pathology including the type of breast cancer,the clinical staging, the significance of ER, PR and HER-2/neu receptors and the implications for treatment. After reviewing the pathology in detail, we proceeded to discuss the different treatment options between surgery, radiation, chemotherapy, antiestrogen therapies. ? ?Treatment plan: ?1.  Breast conserving surgery ?2. +/- RT ?3.  Adjuvant antiestrogen therapy ? ?Return to clinic after surgery to discuss the final pathology report and to start her on antiestrogens. ?

## 2022-01-04 NOTE — Progress Notes (Signed)
[facility] ?CONSULT NOTE ? ?Patient Care Team: ?Alroy Dust, L.Marlou Sa, MD as PCP - General (Family Medicine) ?[name], MD as Consulting Physician (General Surgery) ?Nicholas Lose, MD as Consulting Physician (Hematology and Oncology) ?Kyung Rudd, MD as Consulting Physician (Radiation Oncology) ?Mauro Kaufmann, RN as Oncology Nurse Navigator ?Rockwell Germany, RN as Oncology Nurse Navigator ? ?CHIEF COMPLAINTS/PURPOSE OF CONSULTATION:  ?Newly diagnosed breast cancer ? ?HISTORY OF PRESENTING ILLNESS:  ?Melanie Kirby 76 y.o. female is here because of recent diagnosis of right breast cancer ?Screening mammogram detected suspicious 7 mm mass in the right breast. ?Biopsy showed grade 2 invasive ductal carcinoma ER 95%, PR 95%, HER2 equivocal by IHC, FISH pending, Ki-67 less than 5% Prognostic indicators significant for ER 95% positive; PR 95% positive, Her2 status 2+;  Ki 67 5%  ?  ? ?I reviewed her records extensively and collaborated the history with the patient. ? ?SUMMARY OF ONCOLOGIC HISTORY: ?Oncology History  ?Malignant neoplasm of lower-inner quadrant of right female breast ([place])  ?12/23/2021 Initial Diagnosis  ? Screening mammogram detected right breast mass 0.7 cm at 4 o'clock position.  Axilla negative, biopsy revealed grade 2 IDC ER 95%, PR 95%, HER2 equivocal by IHC, FISH pending, Ki-67 less than 5% ?  ? ? ? ?MEDICAL HISTORY:  ?Past Medical History:  ?Diagnosis Date  ? Diverticulosis   ? Family history of breast cancer 01/04/2022  ? Family history of melanoma 01/04/2022  ? High cholesterol   ? Hypertension   ? ? ?SURGICAL HISTORY: ?Past Surgical History:  ?Procedure Laterality Date  ? ABDOMINAL HYSTERECTOMY    ? ? ?SOCIAL HISTORY: ?Social History  ? ?Socioeconomic History  ? Marital status: Married  ?  Spouse name: Not on file  ? Number of children: Not on file  ? Years of education: Not on file  ? Highest education level: Not on file  ?Occupational History  ? Not on file   ?Tobacco Use  ? Smoking status: Never  ? Smokeless tobacco: Never  ?Substance and Sexual Activity  ? Alcohol use: Never  ? Drug use: Never  ? Sexual activity: Not on file  ?Other Topics Concern  ? Not on file  ?Social History Narrative  ? Not on file  ? ?Social Determinants of Health  ? ?Financial Resource Strain: Not on file  ?Food Insecurity: Not on file  ?Transportation Needs: Not on file  ?Physical Activity: Not on file  ?Stress: Not on file  ?Social Connections: Not on file  ?Intimate Partner Violence: Not on file  ? ? ?FAMILY HISTORY: ?Family History  ?Problem Relation Age of Onset  ? Melanoma Sister   ?     dx 70s; metastatic  ? Lung cancer Maternal Grandfather   ? Breast cancer Niece   ?     dx 61s  ? ? ?ALLERGIES:  is allergic to allopurinol, other, and pravachol [pravastatin]. ? ?MEDICATIONS:  ?Current Outpatient Medications  ?Medication Sig Dispense Refill  ? benazepril (LOTENSIN) 40 MG tablet     ? colchicine 0.6 MG tablet Take one capsule daily for maintenance or as needed for gout flares 90 tablet 3  ? Multiple Vitamin (MULTIVITAMIN) capsule Take 1 capsule by mouth daily.    ? Omega-3 Fatty Acids (FISH OIL PO) Take by mouth.    ? simvastatin (ZOCOR) 40 MG tablet     ? ?No current facility-administered medications for this visit.  ? ? ?REVIEW OF SYSTEMS:   ?  ?All other systems were reviewed with  the patient and are negative. ? ?PHYSICAL EXAMINATION: ?ECOG PERFORMANCE STATUS: 1 - Symptomatic but completely ambulatory ? ?Vitals:  ? 01/04/22 1249  ?BP: 129/67  ?Pulse: 65  ?Resp: 18  ?Temp: 97.7 ?F (36.5 ?C)  ?SpO2: 99%  ? ?Filed Weights  ? 01/04/22 1249  ?Weight: 164 lb 3.2 oz (74.5 kg)  ? ?  ? ?LABORATORY DATA:  ?I have reviewed the data as listed ?Lab Results  ?Component Value Date  ? WBC 10.1 01/04/2022  ? HGB 15.8 (H) 01/04/2022  ? HCT 46.3 (H) 01/04/2022  ? MCV 99.1 01/04/2022  ? PLT 269 01/04/2022  ? ?Lab Results  ?Component Value Date  ? NA 140 01/04/2022  ? K 4.5 01/04/2022  ? CL 104 01/04/2022   ? CO2 29 01/04/2022  ? ? ?RADIOGRAPHIC STUDIES: ?I have personally reviewed the radiological reports and agreed with the findings in the report. ? ?ASSESSMENT AND PLAN:  ?Malignant neoplasm of lower-inner quadrant of right female breast ([place]) ?12/23/2021:Screening mammogram detected right breast mass 0.7 cm at 4 o'clock position.  Axilla negative, biopsy revealed grade 2 IDC ER 95%, PR 95%, HER2 equivocal by IHC, FISH pending, Ki-67 less than 5% ? ?Pathology and radiology counseling: Discussed with the patient, the details of pathology including the type of breast cancer,the clinical staging, the significance of ER, PR and HER-2/neu receptors and the implications for treatment. After reviewing the pathology in detail, we proceeded to discuss the different treatment options between surgery, radiation, chemotherapy, antiestrogen therapies. ? ?Treatment plan: ?1.  Breast conserving surgery ?2. +/- RT ?3.  Adjuvant antiestrogen therapy ? ?Return to clinic after surgery to discuss the final pathology report and to start her on antiestrogens. ? ? ?All questions were answered. The patient knows to call the clinic with any problems, questions or concerns. ?  ? Harriette Ohara, MD ?01/04/22 ? I Gardiner Coins am scribing for Dr. Lindi Adie ? ?I have reviewed the above documentation for accuracy and completeness, and I agree with the above. ?  ?

## 2022-01-05 ENCOUNTER — Other Ambulatory Visit: Payer: Self-pay | Admitting: Surgery

## 2022-01-05 DIAGNOSIS — Z853 Personal history of malignant neoplasm of breast: Secondary | ICD-10-CM

## 2022-01-09 ENCOUNTER — Telehealth: Payer: Self-pay | Admitting: Hematology and Oncology

## 2022-01-09 NOTE — Telephone Encounter (Signed)
Scheduled appointment per inbasket message. Patient aware.   ?

## 2022-01-12 ENCOUNTER — Encounter: Payer: Self-pay | Admitting: *Deleted

## 2022-01-12 ENCOUNTER — Telehealth: Payer: Self-pay | Admitting: *Deleted

## 2022-01-12 NOTE — Telephone Encounter (Signed)
Left message for a return phone call to follow up from [facility] 5/10 and assess navigation needs.

## 2022-01-24 NOTE — Progress Notes (Signed)
Surgical Instructions    Your procedure is scheduled on Wednesday, 02/01/22.  Report to [facility] Main Entrance "A" at 12:00 P.M., then check in with the Admitting office.  Call this number if you have problems the morning of surgery:  [phone]   If you have any questions prior to your surgery date call [phone]: Open Monday-Friday 8am-4pm    Remember:  Do not eat after midnight the night before your surgery  You may drink clear liquids until 11:00am the morning of your surgery.   Clear liquids allowed are: Water, Non-Citrus Juices (without pulp), Carbonated Beverages, Clear Tea, Black Coffee ONLY (NO MILK, CREAM OR POWDERED CREAMER of any kind), and Gatorade  Patient Instructions  The night before surgery:  No food after midnight. ONLY clear liquids after midnight  The day of surgery (if you do NOT have diabetes):  Drink ONE (1) Pre-Surgery Clear Ensure by 11:00am the morning of surgery. Drink in one sitting. Do not sip.  This drink was given to you during your hospital  pre-op appointment visit. Nothing else to drink after completing the  Pre-Surgery Clear Ensure.           If you have questions, please contact your surgeon's office.     Take these medicines the morning of surgery with A SIP OF WATER:  colchicine    As of today, STOP taking any Aspirin (unless otherwise instructed by your surgeon) Aleve, Naproxen, Ibuprofen, Motrin, Advil, Goody's, BC's, all herbal medications, fish oil, and all vitamins.           Do not wear jewelry or makeup Do not wear lotions, powders, perfumes/colognes, or deodorant. Do not shave 48 hours prior to surgery.   Do not bring valuables to the hospital. Do not wear nail polish, gel polish, artificial nails, or any other type of covering on natural nails (fingers and toes) If you have artificial nails or gel coating that need to be removed by a nail salon, please have this removed prior to surgery. Artificial nails or gel coating  may interfere with anesthesia's ability to adequately monitor your vital signs.  [facility] is not responsible for any belongings or valuables. .   Do NOT Smoke (Tobacco/Vaping)  24 hours prior to your procedure  If you use a CPAP at night, you may bring your mask for your overnight stay.   Contacts, glasses, hearing aids, dentures or partials may not be worn into surgery, please bring cases for these belongings   For patients admitted to the hospital, discharge time will be determined by your treatment team.   Patients discharged the day of surgery will not be allowed to drive home, and someone needs to stay with them for 24 hours.   SURGICAL WAITING ROOM VISITATION Patients having surgery or a procedure in a hospital may have two support people. Children under the age of 32 must have an adult with them who is not the patient. They may stay in the waiting area during the procedure and may switch out with other visitors. If the patient needs to stay at the hospital during part of their recovery, the visitor guidelines for inpatient rooms apply.  Please refer to the [facility] website for the visitor guidelines for Inpatients (after your surgery is over and you are in a regular room).       Special instructions:    Oral Hygiene is also important to reduce your risk of infection.  Remember - BRUSH YOUR TEETH THE MORNING OF SURGERY  WITH YOUR REGULAR TOOTHPASTE   [facility]- Preparing For Surgery  Before surgery, you can play an important role. Because skin is not sterile, your skin needs to be as free of germs as possible. You can reduce the number of germs on your skin by washing with CHG (chlorahexidine gluconate) Soap before surgery.  CHG is an antiseptic cleaner which kills germs and bonds with the skin to continue killing germs even after washing.     Please do not use if you have an allergy to CHG or antibacterial soaps. If your skin becomes reddened/irritated stop using the  CHG.  Do not shave (including legs and underarms) for at least 48 hours prior to first CHG shower. It is OK to shave your face.  Please follow these instructions carefully.     Shower the NIGHT BEFORE SURGERY and the MORNING OF SURGERY with CHG Soap.   If you chose to wash your hair, wash your hair first as usual with your normal shampoo. After you shampoo, rinse your hair and body thoroughly to remove the shampoo.  Then [employer] and genitals (private parts) with your normal soap and rinse thoroughly to remove soap.  After that Use CHG Soap as you would any other liquid soap. You can apply CHG directly to the skin and wash gently with a scrungie or a clean washcloth.   Apply the CHG Soap to your body ONLY FROM THE NECK DOWN.  Do not use on open wounds or open sores. Avoid contact with your eyes, ears, mouth and genitals (private parts). Wash Face and genitals (private parts)  with your normal soap.   Wash thoroughly, paying special attention to the area where your surgery will be performed.  Thoroughly rinse your body with warm water from the neck down.  DO NOT shower/wash with your normal soap after using and rinsing off the CHG Soap.  Pat yourself dry with a CLEAN TOWEL.  Wear CLEAN PAJAMAS to bed the night before surgery  Place CLEAN SHEETS on your bed the night before your surgery  DO NOT SLEEP WITH PETS.   Day of Surgery: Take a shower with CHG soap. Wear Clean/Comfortable clothing the morning of surgery Do not apply any deodorants/lotions.   Remember to brush your teeth WITH YOUR REGULAR TOOTHPASTE.    If you received a COVID test during your pre-op visit, it is requested that you wear a mask when out in public, stay away from anyone that may not be feeling well, and notify your surgeon if you develop symptoms. If you have been in contact with anyone that has tested positive in the last 10 days, please notify your surgeon.    Please read over the following fact sheets  that you were given.

## 2022-01-25 ENCOUNTER — Encounter (HOSPITAL_COMMUNITY): Payer: Self-pay

## 2022-01-25 ENCOUNTER — Encounter (HOSPITAL_COMMUNITY)
Admission: RE | Admit: 2022-01-25 | Discharge: 2022-01-25 | Disposition: A | Discharge status: Home / Self Care | Payer: Medicare Other | Source: Ambulatory Visit | Attending: Surgery | Admitting: Surgery

## 2022-01-25 ENCOUNTER — Other Ambulatory Visit: Payer: Self-pay

## 2022-01-25 VITALS — BP 132/67 | HR 64 | Temp 97.6°F | Resp 18 | Ht 65.0 in | Wt 162.6 lb

## 2022-01-25 DIAGNOSIS — Z01810 Encounter for preprocedural cardiovascular examination: Secondary | ICD-10-CM | POA: Diagnosis not present

## 2022-01-25 DIAGNOSIS — I2510 Atherosclerotic heart disease of native coronary artery without angina pectoris: Secondary | ICD-10-CM

## 2022-01-25 HISTORY — DX: Other specified postprocedural states: Z98.890

## 2022-01-25 HISTORY — DX: Other complications of anesthesia, initial encounter: T88.59XA

## 2022-01-25 NOTE — Progress Notes (Signed)
PCP - Dr. Donnie Coffin Cardiologist - Denies Oncology: Dr. Nicholas Lose  PPM/ICD - Denies  Chest x-ray - N/A EKG - 01/25/22 Stress Test - Denies ECHO - Denies Cardiac Cath - Denies  Sleep Study - Denies  Patient is not diabetic  Blood Thinner Instructions: N/A Aspirin Instructions: N/A  ERAS Protcol - Yes, PRE-SURGERY Ensure   COVID TEST- N/A   Anesthesia review: Yes, abnormal EKG  Patient denies shortness of breath, fever, cough and chest pain at PAT appointment   All instructions explained to the patient, with a verbal understanding of the material. Patient agrees to go over the instructions while at home for a better understanding. Patient also instructed to self quarantine after being tested for COVID-19. The opportunity to ask questions was provided.

## 2022-01-31 ENCOUNTER — Ambulatory Visit
Admission: RE | Admit: 2022-01-31 | Discharge: 2022-01-31 | Disposition: A | Discharge status: Home / Self Care | Payer: Medicare Other | Source: Ambulatory Visit | Attending: Surgery | Admitting: Surgery

## 2022-01-31 DIAGNOSIS — C50911 Malignant neoplasm of unspecified site of right female breast: Secondary | ICD-10-CM | POA: Diagnosis not present

## 2022-01-31 DIAGNOSIS — Z853 Personal history of malignant neoplasm of breast: Secondary | ICD-10-CM

## 2022-01-31 NOTE — H&P (Signed)
PROVIDER: Beverlee Nims, MD  MRN: [id number] DOB: August 30, 1945  Subjective   Chief Complaint: Breast Cancer   History of Present Illness: Melanie Kirby is a 76 y.o. female who is seen as an office consultation for evaluation of Breast Cancer .   This is a 76 year old female who was found on recent screening mammography to have a small mass in her inner quadrant of the right breast. It was approximate 4:00. It measured 7 mm. Ultrasound of the axilla was negative. She had a biopsy showing invasive grade 2 ductal carcinoma. It was 95% ER/PR positive, Ki-67 was less than 5%. HER2 status is negative. She has had no previous problems during her breast. She has no cardiopulmonary issues.   Review of Systems: A complete review of systems was obtained from the patient. I have reviewed this information and discussed as appropriate with the patient. See HPI as well for other ROS.  ROS   Medical History: Past Medical History:  Diagnosis Date   DVT (deep venous thrombosis) (CMS-HCC)   Hyperlipidemia   Hypertension   Patient Active Problem List  Diagnosis   Breast cancer (CMS-HCC)   Chronic kidney disease, stage 3b (CMS-HCC)   Gastroesophageal reflux disease   Hypercholesterolemia   Diverticular disease of colon   Malignant neoplasm of lower-inner quadrant of right female breast (CMS-HCC)   Past Surgical History:  Procedure Laterality Date   HYSTERECTOMY    Allergies  Allergen Reactions   Allopurinol Other (See Comments)   Other Other (See Comments)   Pravastatin Other (See Comments)   Current Outpatient Medications on File Prior to Visit  Medication Sig Dispense Refill   colchicine (COLCRYS) 0.6 mg tablet Take 1 tablet by mouth once daily   methocarbamoL (ROBAXIN) 500 MG tablet Take 1 tablet by mouth 3 (three) times daily as needed   simvastatin (ZOCOR) 40 MG tablet Take 1 tablet by mouth every evening   No current facility-administered medications on file  prior to visit.   Family History  Problem Relation Age of Onset   High blood pressure (Hypertension) Sister    Social History   Tobacco Use  Smoking Status Never  Smokeless Tobacco Never    Social History   Socioeconomic History   Marital status: Married  Tobacco Use   Smoking status: Never   Smokeless tobacco: Never  Vaping Use   Vaping Use: Never used  Substance and Sexual Activity   Alcohol use: Never   Drug use: Never   Sexual activity: Defer   Objective:  There were no vitals filed for this visit.  There is no height or weight on file to calculate BMI.  Physical Exam   She appears well on exam.  There is ecchymosis and a very tiny hematoma at the area of concern on the right breast. The nipple areolar complex is normal. There are no other breast masses.  There is no axillary adenopathy.  Labs, Imaging and Diagnostic Testing: I have reviewed her mammograms, ultrasound, and pathology results  Assessment and Plan:   Diagnoses and all orders for this visit:  Invasive ductal carcinoma of right breast (CMS-HCC)    This is a patient with a very small invasive right breast cancer. At this point I discussed with the patient and her husband regarding surgical options recurrence of breast cancer. We discussed breast conservation with lumpectomy versus mastectomy. She is interested in breast conservation. I next discussed proceeding with a radioactive seed right breast lumpectomy. I explained the procedure in detail. We  discussed the risks. These include but are not limited to bleeding, infection, injury to surrounding structures, the need for further procedures heart positive, cardiopulmonary issues, postoperative recovery, etc. We can also discuss currently in a multidisciplinary breast cancer conference.

## 2022-01-31 NOTE — Progress Notes (Signed)
Patient voiced understanding of new arrival time of 0800 and finish ERAS drink by 0700.

## 2022-01-31 NOTE — Progress Notes (Signed)
Patient Care Team: Alroy Dust, L.Marlou Sa, MD as PCP - General (Family Medicine) [name], MD as Consulting Physician (General Surgery) Nicholas Lose, MD as Consulting Physician (Hematology and Oncology) Kyung Rudd, MD as Consulting Physician (Radiation Oncology) Mauro Kaufmann, RN as Oncology Nurse Navigator Rockwell Germany, RN as Oncology Nurse Navigator  DIAGNOSIS:  Encounter Diagnosis  Name Primary?   Malignant neoplasm of lower-inner quadrant of right breast of female, estrogen receptor positive ([place])     SUMMARY OF ONCOLOGIC HISTORY: Oncology History  Malignant neoplasm of lower-inner quadrant of right female breast ([place])  12/23/2021 Initial Diagnosis   Screening mammogram detected right breast mass 0.7 cm at 4 o'clock position.  Axilla negative, biopsy revealed grade 2 IDC ER 95%, PR 95%, HER2 equivocal by IHC, FISH pending, Ki-67 less than 5%     CHIEF COMPLIANT: Follow-up after surgery.  INTERVAL HISTORY: Melanie Kirby is a [place] 76 y.o. female is here because of recent diagnosis of right breast cancer. She presents to the clinic today for a follow-up after surgery. States that surgery went well. States that she was concern about having surgery again.   ALLERGIES:  is allergic to allopurinol and pravachol [pravastatin].  MEDICATIONS:  Current Outpatient Medications  Medication Sig Dispense Refill   letrozole (FEMARA) 2.5 MG tablet Take 1 tablet (2.5 mg total) by mouth daily. 90 tablet 3   benazepril (LOTENSIN) 40 MG tablet Take 40 mg by mouth in the morning.     colchicine 0.6 MG tablet Take one capsule daily for maintenance or as needed for gout flares (Patient taking differently: Take 0.6 mg by mouth in the morning.) 90 tablet 3   Multiple Vitamin (MULTIVITAMINS PO) Take 2 tablets by mouth daily.     simvastatin (ZOCOR) 40 MG tablet Take 40 mg by mouth daily in the afternoon.     No current facility-administered medications for this visit.     PHYSICAL EXAMINATION: ECOG PERFORMANCE STATUS: 1 - Symptomatic but completely ambulatory  Vitals:   02/14/22 0939  BP: 129/72  Pulse: 62  Resp: 16  Temp: (!) 97.5 F (36.4 C)  SpO2: 100%   Filed Weights   02/14/22 0939  Weight: 161 lb 6.4 oz (73.2 kg)      LABORATORY DATA:  I have reviewed the data as listed    Latest Ref Rng & Units 01/04/2022   11:59 AM  CMP  Glucose 70 - 99 mg/dL 94   BUN 8 - 23 mg/dL 25   Creatinine 0.44 - 1.00 mg/dL 1.35   Sodium 135 - 145 mmol/L 140   Potassium 3.5 - 5.1 mmol/L 4.5   Chloride 98 - 111 mmol/L 104   CO2 22 - 32 mmol/L 29   Calcium 8.9 - 10.3 mg/dL 10.0   Total Protein 6.5 - 8.1 g/dL 7.8   Total Bilirubin 0.3 - 1.2 mg/dL 0.7   Alkaline Phos 38 - 126 U/L 81   AST 15 - 41 U/L 21   ALT 0 - 44 U/L 21     Lab Results  Component Value Date   WBC 10.1 01/04/2022   HGB 15.8 (H) 01/04/2022   HCT 46.3 (H) 01/04/2022   MCV 99.1 01/04/2022   PLT 269 01/04/2022   NEUTROABS 5.0 01/04/2022    ASSESSMENT & PLAN:  Malignant neoplasm of lower-inner quadrant of right female breast ([place]) He 12/23/2021:Screening mammogram detected right breast mass 0.7 cm at 4 o'clock position.  Axilla negative, biopsy revealed grade 2 IDC ER 95%,  PR 95%, HER2 equivocal by IHC, FISH pending, Ki-67 less than 5%  02/01/22: Rt Lumpectomy: Grade 1 IDC with DCIS 1 cm size, ER 95%, PR 95%, Her 2 Neg, Ki 67:<5%  Pathology counseling: I discussed the final pathology report of the patient provided  a copy of this report. I discussed the margins as well as lymph node surgeries. We also discussed the final staging along with previously performed ER/PR and HER-2/neu testing.  Treatment Plan:  1.  DCIS margin posteriorly was positive: She is undergoing resection of the margins 2. Adjuvant Anti estrogen therapy with Letrozole  Letrozole counseling: We discussed the risks and benefits of anti-estrogen therapy with aromatase inhibitors. These include but not limited to  insomnia, hot flashes, mood changes, vaginal dryness, bone density loss, and weight gain. We strongly believe that the benefits far outweigh the risks. Patient understands these risks and consented to starting treatment. Planned treatment duration is 5 years.  RTC in 3 months for SCP visit      No orders of the defined types were placed in this encounter.  The patient has a good understanding of the overall plan. she agrees with it. she will call with any problems that may develop before the next visit here. Total time spent: 30 mins including face to face time and time spent for planning, charting and co-ordination of care   Harriette Ohara, MD 02/14/22    I Gardiner Coins am scribing for Dr. Lindi Adie  I have reviewed the above documentation for accuracy and completeness, and I agree with the above.

## 2022-02-01 ENCOUNTER — Ambulatory Visit (HOSPITAL_COMMUNITY)
Admission: RE | Admit: 2022-02-01 | Discharge: 2022-02-01 | Disposition: A | Discharge status: Home / Self Care | Payer: Medicare Other | Attending: Surgery | Admitting: Surgery

## 2022-02-01 ENCOUNTER — Other Ambulatory Visit: Payer: Self-pay

## 2022-02-01 ENCOUNTER — Ambulatory Visit (HOSPITAL_COMMUNITY): Payer: Medicare Other | Admitting: Physician Assistant

## 2022-02-01 ENCOUNTER — Ambulatory Visit
Admission: RE | Admit: 2022-02-01 | Discharge: 2022-02-01 | Disposition: A | Discharge status: Home / Self Care | Payer: Medicare Other | Source: Ambulatory Visit | Attending: Surgery | Admitting: Surgery

## 2022-02-01 ENCOUNTER — Ambulatory Visit (HOSPITAL_BASED_OUTPATIENT_CLINIC_OR_DEPARTMENT_OTHER): Payer: Medicare Other | Admitting: Anesthesiology

## 2022-02-01 ENCOUNTER — Encounter (HOSPITAL_COMMUNITY)
Admission: RE | Disposition: A | Discharge status: Home / Self Care | Payer: Self-pay | Source: Home / Self Care | Attending: Surgery

## 2022-02-01 ENCOUNTER — Encounter (HOSPITAL_COMMUNITY): Payer: Self-pay | Admitting: Surgery

## 2022-02-01 DIAGNOSIS — I129 Hypertensive chronic kidney disease with stage 1 through stage 4 chronic kidney disease, or unspecified chronic kidney disease: Secondary | ICD-10-CM | POA: Diagnosis not present

## 2022-02-01 DIAGNOSIS — Z170 Estrogen receptor positive status [ER+]: Secondary | ICD-10-CM | POA: Insufficient documentation

## 2022-02-01 DIAGNOSIS — I10 Essential (primary) hypertension: Secondary | ICD-10-CM

## 2022-02-01 DIAGNOSIS — N1832 Chronic kidney disease, stage 3b: Secondary | ICD-10-CM | POA: Diagnosis not present

## 2022-02-01 DIAGNOSIS — C50311 Malignant neoplasm of lower-inner quadrant of right female breast: Secondary | ICD-10-CM | POA: Diagnosis not present

## 2022-02-01 DIAGNOSIS — C50911 Malignant neoplasm of unspecified site of right female breast: Secondary | ICD-10-CM

## 2022-02-01 DIAGNOSIS — Z853 Personal history of malignant neoplasm of breast: Secondary | ICD-10-CM

## 2022-02-01 HISTORY — PX: BREAST LUMPECTOMY WITH RADIOACTIVE SEED LOCALIZATION: SHX6424

## 2022-02-01 SURGERY — BREAST LUMPECTOMY WITH RADIOACTIVE SEED LOCALIZATION
Anesthesia: General | Site: Breast | Laterality: Right

## 2022-02-01 MED ORDER — ACETAMINOPHEN 500 MG PO TABS: 1000.0000 mg | ORAL_TABLET | ORAL | Status: AC

## 2022-02-01 MED ORDER — LIDOCAINE 2% (20 MG/ML) 5 ML SYRINGE
INTRAMUSCULAR | Status: DC | PRN
  Administered 2022-02-01: 40 mg via INTRAVENOUS

## 2022-02-01 MED ORDER — CHLORHEXIDINE GLUCONATE 0.12 % MT SOLN: 15.0000 mL | Freq: Once | OROMUCOSAL | Status: AC

## 2022-02-01 MED ORDER — CHLORHEXIDINE GLUCONATE 0.12 % MT SOLN
OROMUCOSAL | Status: AC
  Administered 2022-02-01: 15 mL via OROMUCOSAL
  Filled 2022-02-01: qty 15

## 2022-02-01 MED ORDER — BUPIVACAINE-EPINEPHRINE 0.25% -1:200000 IJ SOLN
INTRAMUSCULAR | Status: DC | PRN
  Administered 2022-02-01: 10 mL

## 2022-02-01 MED ORDER — ONDANSETRON HCL 4 MG/2ML IJ SOLN
INTRAMUSCULAR | Status: DC | PRN
  Administered 2022-02-01: 4 mg via INTRAVENOUS

## 2022-02-01 MED ORDER — 0.9 % SODIUM CHLORIDE (POUR BTL) OPTIME
TOPICAL | Status: DC | PRN
  Administered 2022-02-01: 500 mL

## 2022-02-01 MED ORDER — CEFAZOLIN SODIUM-DEXTROSE 2-4 GM/100ML-% IV SOLN
INTRAVENOUS | Status: AC
  Filled 2022-02-01: qty 100

## 2022-02-01 MED ORDER — ACETAMINOPHEN 500 MG PO TABS
ORAL_TABLET | ORAL | Status: AC
  Administered 2022-02-01: 1000 mg via ORAL
  Filled 2022-02-01: qty 2

## 2022-02-01 MED ORDER — TRAMADOL HCL 50 MG PO TABS: 50.0000 mg | ORAL_TABLET | Freq: Four times a day (QID) | ORAL | 0 refills | Status: DC | PRN

## 2022-02-01 MED ORDER — FENTANYL CITRATE (PF) 250 MCG/5ML IJ SOLN
INTRAMUSCULAR | Status: AC
  Filled 2022-02-01: qty 5

## 2022-02-01 MED ORDER — CEFAZOLIN SODIUM-DEXTROSE 2-4 GM/100ML-% IV SOLN
2.0000 g | INTRAVENOUS | Status: AC
  Administered 2022-02-01: 2 g via INTRAVENOUS

## 2022-02-01 MED ORDER — BUPIVACAINE-EPINEPHRINE (PF) 0.25% -1:200000 IJ SOLN
INTRAMUSCULAR | Status: AC
  Filled 2022-02-01: qty 30

## 2022-02-01 MED ORDER — MIDAZOLAM HCL 2 MG/2ML IJ SOLN
INTRAMUSCULAR | Status: DC | PRN
  Administered 2022-02-01: 2 mg via INTRAVENOUS

## 2022-02-01 MED ORDER — FENTANYL CITRATE (PF) 250 MCG/5ML IJ SOLN
INTRAMUSCULAR | Status: DC | PRN
  Administered 2022-02-01: 50 ug via INTRAVENOUS
  Administered 2022-02-01: 100 ug via INTRAVENOUS

## 2022-02-01 MED ORDER — ENSURE PRE-SURGERY PO LIQD: 296.0000 mL | Freq: Once | ORAL | Status: DC

## 2022-02-01 MED ORDER — PROPOFOL 10 MG/ML IV BOLUS
INTRAVENOUS | Status: DC | PRN
  Administered 2022-02-01: 150 mg via INTRAVENOUS

## 2022-02-01 MED ORDER — LIDOCAINE 2% (20 MG/ML) 5 ML SYRINGE
INTRAMUSCULAR | Status: AC
  Filled 2022-02-01: qty 5

## 2022-02-01 MED ORDER — EPHEDRINE SULFATE-NACL 50-0.9 MG/10ML-% IV SOSY
PREFILLED_SYRINGE | INTRAVENOUS | Status: DC | PRN
  Administered 2022-02-01: 5 mg via INTRAVENOUS
  Administered 2022-02-01: 10 mg via INTRAVENOUS

## 2022-02-01 MED ORDER — CHLORHEXIDINE GLUCONATE CLOTH 2 % EX PADS: 6.0000 | MEDICATED_PAD | Freq: Once | CUTANEOUS | Status: DC

## 2022-02-01 MED ORDER — DEXAMETHASONE SODIUM PHOSPHATE 10 MG/ML IJ SOLN
INTRAMUSCULAR | Status: DC | PRN
  Administered 2022-02-01: 4 mg via INTRAVENOUS

## 2022-02-01 MED ORDER — PROPOFOL 10 MG/ML IV BOLUS
INTRAVENOUS | Status: AC
  Filled 2022-02-01: qty 20

## 2022-02-01 MED ORDER — ORAL CARE MOUTH RINSE: 15.0000 mL | Freq: Once | OROMUCOSAL | Status: AC

## 2022-02-01 MED ORDER — HYDROMORPHONE HCL 1 MG/ML IJ SOLN: 0.2500 mg | INTRAMUSCULAR | Status: DC | PRN

## 2022-02-01 MED ORDER — LACTATED RINGERS IV SOLN: INTRAVENOUS | Status: DC

## 2022-02-01 MED ORDER — MIDAZOLAM HCL 2 MG/2ML IJ SOLN
INTRAMUSCULAR | Status: AC
Start: 2022-02-01 — End: ?
  Filled 2022-02-01: qty 2

## 2022-02-01 SURGICAL SUPPLY — 30 items
APPLIER CLIP 9.375 MED OPEN (MISCELLANEOUS) ×2
BAG COUNTER SPONGE SURGICOUNT (BAG) ×2 IMPLANT
BINDER BREAST XLRG (GAUZE/BANDAGES/DRESSINGS) ×1 IMPLANT
CANISTER SUCT 3000ML PPV (MISCELLANEOUS) ×2 IMPLANT
CHLORAPREP W/TINT 26 (MISCELLANEOUS) ×2 IMPLANT
CLIP APPLIE 9.375 MED OPEN (MISCELLANEOUS) ×1 IMPLANT
COVER PROBE W GEL 5X96 (DRAPES) ×2 IMPLANT
COVER SURGICAL LIGHT HANDLE (MISCELLANEOUS) ×2 IMPLANT
DERMABOND ADVANCED (GAUZE/BANDAGES/DRESSINGS) ×1
DERMABOND ADVANCED .7 DNX12 (GAUZE/BANDAGES/DRESSINGS) ×1 IMPLANT
DEVICE DUBIN SPECIMEN MAMMOGRA (MISCELLANEOUS) ×2 IMPLANT
DRAPE CHEST BREAST 15X10 FENES (DRAPES) ×2 IMPLANT
ELECT REM PT RETURN 9FT ADLT (ELECTROSURGICAL) ×2
ELECTRODE REM PT RTRN 9FT ADLT (ELECTROSURGICAL) ×1 IMPLANT
GLOVE SURG SIGNA 7.5 PF LTX (GLOVE) ×2 IMPLANT
GOWN STRL REUS W/ TWL LRG LVL3 (GOWN DISPOSABLE) ×1 IMPLANT
GOWN STRL REUS W/ TWL XL LVL3 (GOWN DISPOSABLE) ×1 IMPLANT
GOWN STRL REUS W/TWL LRG LVL3 (GOWN DISPOSABLE) ×2
GOWN STRL REUS W/TWL XL LVL3 (GOWN DISPOSABLE) ×2
KIT BASIN OR (CUSTOM PROCEDURE TRAY) ×2 IMPLANT
KIT MARKER MARGIN INK (KITS) ×2 IMPLANT
NDL HYPO 25GX1X1/2 BEV (NEEDLE) ×1 IMPLANT
NEEDLE HYPO 25GX1X1/2 BEV (NEEDLE) ×2 IMPLANT
NS IRRIG 1000ML POUR BTL (IV SOLUTION) ×1 IMPLANT
PACK GENERAL/GYN (CUSTOM PROCEDURE TRAY) ×2 IMPLANT
SUT MNCRL AB 4-0 PS2 18 (SUTURE) ×2 IMPLANT
SUT VIC AB 3-0 SH 18 (SUTURE) ×2 IMPLANT
SYR CONTROL 10ML LL (SYRINGE) ×2 IMPLANT
TOWEL GREEN STERILE (TOWEL DISPOSABLE) ×2 IMPLANT
TOWEL GREEN STERILE FF (TOWEL DISPOSABLE) ×2 IMPLANT

## 2022-02-01 NOTE — Op Note (Signed)
Preoperative diagnosis: 109m Invasive ductal carcinoma, ER+/PR+  Postoperative diagnosis: same   Procedure: Lumpectomy with radioactive seed localization  Surgeon: DCoralie Keens M.D.  Asst: HRadonna Ricker MD  Anesthesia: General  Indications for procedure: Melanie PEEKis a 76y.o. year old female with biopsy demonstrated invasive breast cancer, ER+/PR+.   Description of procedure: The patient was brought into the operative suite. Anesthesia was administered with General endotracheal anesthesia. WHO checklist was applied. The patient was then placed in supine position. The area was prepped and draped in the usual sterile fashion.  A time out was called correctly identifying the patient, the laterality as RIGHT, and the procedure as lumpectomy with radioactive seed localizaton.   We began by localizing the radioactive seed using the Neoprobe with returned a signal of 11,000. We made an incision over the lesion and created subcutaneous flaps. We carried our dissection down toward the chest wall circumferentially, repeatedly localizing the seed with the neo probe. We were then able to exteriorize a column of breast tissue which read with a similarly strong signal to our original localization of the lesion. At this point we divided the posterior margin of the dissected breast tissue.   We then used the paint kit to mark the lateral, superior, medial, inferior, anterior, and posterior margins of the specimen. The specimen was then handed off. X-ray demonstrated two slips and the radioactive seed.  We then confirmed hemostasis. We closed the deep space with interrupted 3-0 vicryls. We placed interrupeted 3-0 deep dermal stitches. The skin was closed with 4-0 monocryl and closed with skin glue.   Findings: Breast tissue excised containing radioactive seed and clips.   Specimen: RIGHT breast lumpectomy  Implant: NONE   Blood loss: minimal  Local anesthesia:  10 ml marcaine    Complications: None  HRadonna Ricker MD PGY-5  General Surgery Resident

## 2022-02-01 NOTE — Discharge Instructions (Signed)
[place] Office Phone Number [phone]  BREAST BIOPSY/ PARTIAL MASTECTOMY: POST OP INSTRUCTIONS  Always review your discharge instruction sheet given to you by the facility where your surgery was performed.  IF YOU HAVE DISABILITY OR FAMILY LEAVE FORMS, YOU MUST BRING THEM TO THE OFFICE FOR PROCESSING.  DO NOT GIVE THEM TO YOUR DOCTOR.  A prescription for pain medication may be given to you upon discharge.  Take your pain medication as prescribed, if needed.  If narcotic pain medicine is not needed, then you may take acetaminophen (Tylenol) or ibuprofen (Advil) as needed. Take your usually prescribed medications unless otherwise directed If you need a refill on your pain medication, please contact your pharmacy.  They will contact our office to request authorization.  Prescriptions will not be filled after 5pm or on week-ends. You should eat very light the first 24 hours after surgery, such as soup, crackers, pudding, etc.  Resume your normal diet the day after surgery. Most patients will experience some swelling and bruising in the breast.  Ice packs and a good support bra will help.  Swelling and bruising can take several days to resolve.  It is common to experience some constipation if taking pain medication after surgery.  Increasing fluid intake and taking a stool softener will usually help or prevent this problem from occurring.  A mild laxative (Milk of Magnesia or Miralax) should be taken according to package directions if there are no bowel movements after 48 hours. Unless discharge instructions indicate otherwise, you may remove your bandages 24-48 hours after surgery, and you may shower at that time.  You may have steri-strips (small skin tapes) in place directly over the incision.  These strips should be left on the skin for 7-10 days.  If your surgeon used skin glue on the incision, you may shower in 24 hours.  The glue will flake off over the next 2-3 weeks.  Any  sutures or staples will be removed at the office during your follow-up visit. ACTIVITIES:  You may resume regular daily activities (gradually increasing) beginning the next day.  Wearing a good support bra or sports bra minimizes pain and swelling.  You may have sexual intercourse when it is comfortable. You may drive when you no longer are taking prescription pain medication, you can comfortably wear a seatbelt, and you can safely maneuver your car and apply brakes. RETURN TO WORK:  ______________________________________________________________________________________ Dennis Bast should see your doctor in the office for a follow-up appointment approximately two weeks after your surgery.  Your doctor's nurse will typically make your follow-up appointment when she calls you with your pathology report.  Expect your pathology report 2-3 business days after your surgery.  You may call to check if you do not hear from [place] after three days. OTHER INSTRUCTIONS: YOU MAY REMOVE THE BINDER AND SHOWER STARTING TOMORROW.  YOU CAN WEAR THE BINDER OR A BRA AFTER THAT ICE PACK, TYLENOL, AND IBUPROFEN ALSO FOR PAIN NO VIGOROUS ACTIVITY FOR ONE WEEK _______________________________________________________________________________________________ _____________________________________________________________________________________________________________________________________ _____________________________________________________________________________________________________________________________________ _____________________________________________________________________________________________________________________________________  WHEN TO CALL YOUR DOCTOR: Fever over 101.0 Nausea and/or vomiting. Extreme swelling or bruising. Continued bleeding from incision. Increased pain, redness, or drainage from the incision.  The clinic staff is available to answer your questions during regular business hours.  Please don't  hesitate to call and ask to speak to one of the nurses for clinical concerns.  If you have a medical emergency, go to the nearest emergency room or call 911.  A surgeon from [facility] is always on call at the hospital.  For further questions, please visit [email or web address]

## 2022-02-01 NOTE — Interval H&P Note (Signed)
History and Physical Interval Note: no change in H and P  02/01/2022 8:09 AM  Melanie Kirby  has presented today for surgery, with the diagnosis of RIGHT BREAST CANCER.  The various methods of treatment have been discussed with the patient and family. After consideration of risks, benefits and other options for treatment, the patient has consented to  Procedure(s): RIGHT BREAST LUMPECTOMY WITH RADIOACTIVE SEED LOCALIZATION (Right) as a surgical intervention.  The patient's history has been reviewed, patient examined, no change in status, stable for surgery.  I have reviewed the patient's chart and labs.  Questions were answered to the patient's satisfaction.     [name]

## 2022-02-01 NOTE — Anesthesia Procedure Notes (Signed)
Procedure Name: LMA Insertion Date/Time: 02/01/2022 9:41 AM Performed by: Anastasio Auerbach, CRNA Pre-anesthesia Checklist: Patient identified, Emergency Drugs available, Suction available, Patient being monitored and Timeout performed Patient Re-evaluated:Patient Re-evaluated prior to induction Oxygen Delivery Method: Circle system utilized Preoxygenation: Pre-oxygenation with 100% oxygen Induction Type: IV induction Ventilation: Mask ventilation without difficulty LMA: LMA inserted LMA Size: 4.0 Number of attempts: 1 Tube secured with: Tape Dental Injury: Teeth and Oropharynx as per pre-operative assessment

## 2022-02-01 NOTE — Transfer of Care (Signed)
Immediate Anesthesia Transfer of Care Note  Patient: Melanie Kirby  Procedure(s) Performed: RIGHT BREAST LUMPECTOMY WITH RADIOACTIVE SEED LOCALIZATION (Right: Breast)  Patient Location: PACU  Anesthesia Type:General  Level of Consciousness: awake, alert  and oriented  Airway & Oxygen Therapy: Patient Spontanous Breathing and Patient connected to nasal cannula oxygen  Post-op Assessment: Report given to RN and Post -op Vital signs reviewed and stable  Post vital signs: Reviewed and stable  Last Vitals:  Vitals Value Taken Time  BP 118/66 02/01/22 1037  Temp    Pulse 75 02/01/22 1039  Resp 12 02/01/22 1039  SpO2 96 % 02/01/22 1039  Vitals shown include unvalidated device data.  Last Pain:  Vitals:   02/01/22 0751  TempSrc:   PainSc: 0-No pain         Complications: No notable events documented.

## 2022-02-01 NOTE — Anesthesia Preprocedure Evaluation (Signed)
Anesthesia Evaluation  Patient identified by MRN, date of birth, ID band Patient awake    Reviewed: Allergy & Precautions, NPO status , Patient's Chart, lab work & pertinent test results  Airway Mallampati: II  TM Distance: >3 FB     Dental   Pulmonary neg pulmonary ROS,    breath sounds clear to auscultation       Cardiovascular hypertension,  Rhythm:Regular Rate:Normal     Neuro/Psych negative neurological ROS     GI/Hepatic negative GI ROS, Neg liver ROS,   Endo/Other  negative endocrine ROS  Renal/GU negative Renal ROS     Musculoskeletal   Abdominal   Peds  Hematology   Anesthesia Other Findings   Reproductive/Obstetrics                             Anesthesia Physical Anesthesia Plan  ASA: 3  Anesthesia Plan: General   Post-op Pain Management:    Induction: Intravenous  PONV Risk Score and Plan: Ondansetron, Dexamethasone and Midazolam  Airway Management Planned: LMA  Additional Equipment:   Intra-op Plan:   Post-operative Plan: Extubation in OR  Informed Consent: I have reviewed the patients History and Physical, chart, labs and discussed the procedure including the risks, benefits and alternatives for the proposed anesthesia with the patient or authorized representative who has indicated his/her understanding and acceptance.     Dental advisory given  Plan Discussed with: CRNA and Anesthesiologist  Anesthesia Plan Comments:         Anesthesia Quick Evaluation

## 2022-02-01 NOTE — Anesthesia Postprocedure Evaluation (Signed)
Anesthesia Post Note  Patient: Melanie Kirby  Procedure(s) Performed: RIGHT BREAST LUMPECTOMY WITH RADIOACTIVE SEED LOCALIZATION (Right: Breast)     Patient location during evaluation: PACU Anesthesia Type: General Level of consciousness: awake Pain management: pain level controlled Vital Signs Assessment: post-procedure vital signs reviewed and stable Cardiovascular status: stable Postop Assessment: no apparent nausea or vomiting Anesthetic complications: no   No notable events documented.  Last Vitals:  Vitals:   02/01/22 1035 02/01/22 1050  BP: 118/66 114/63  Pulse: 77 63  Resp: 12 18  Temp: (!) 36.1 C   SpO2: 96% 94%    Last Pain:  Vitals:   02/01/22 1035  TempSrc:   PainSc: 0-No pain                 Tamiko Leopard

## 2022-02-01 NOTE — Progress Notes (Signed)
DOCUMENTED IN [place]

## 2022-02-02 ENCOUNTER — Encounter (HOSPITAL_COMMUNITY): Payer: Self-pay | Admitting: Surgery

## 2022-02-02 LAB — SURGICAL PATHOLOGY

## 2022-02-07 ENCOUNTER — Encounter: Payer: Self-pay | Admitting: *Deleted

## 2022-02-13 ENCOUNTER — Other Ambulatory Visit: Payer: Self-pay | Admitting: Surgery

## 2022-02-13 ENCOUNTER — Encounter: Payer: Self-pay | Admitting: *Deleted

## 2022-02-14 ENCOUNTER — Inpatient Hospital Stay: Payer: Medicare Other | Attending: Hematology and Oncology | Admitting: Hematology and Oncology

## 2022-02-14 ENCOUNTER — Other Ambulatory Visit: Payer: Self-pay

## 2022-02-14 DIAGNOSIS — Z170 Estrogen receptor positive status [ER+]: Secondary | ICD-10-CM

## 2022-02-14 DIAGNOSIS — Z79899 Other long term (current) drug therapy: Secondary | ICD-10-CM | POA: Diagnosis not present

## 2022-02-14 DIAGNOSIS — C50311 Malignant neoplasm of lower-inner quadrant of right female breast: Secondary | ICD-10-CM | POA: Diagnosis not present

## 2022-02-14 MED ORDER — LETROZOLE 2.5 MG PO TABS: 2.5000 mg | ORAL_TABLET | Freq: Every day | ORAL | 3 refills | Status: DC

## 2022-02-14 NOTE — Assessment & Plan Note (Addendum)
He 12/23/2021:Screening mammogram detected right breast mass 0.7 cm at 4 o'clock position.  Axilla negative, biopsy revealed grade 2 IDC ER 95%, PR 95%, HER2 equivocal by IHC, FISH pending, Ki-67 less than 5%  02/01/22: Rt Lumpectomy: Grade 1 IDC with DCIS 1 cm size, ER 95%, PR 95%, Her 2 Neg, Ki 67:<5%  Pathology counseling: I discussed the final pathology report of the patient provided  a copy of this report. I discussed the margins as well as lymph node surgeries. We also discussed the final staging along with previously performed ER/PR and HER-2/neu testing.  Treatment Plan:  1.  DCIS margin posteriorly was positive: She is undergoing resection of the margins 2. Adjuvant Anti estrogen therapy with Letrozole  Letrozole counseling: We discussed the risks and benefits of anti-estrogen therapy with aromatase inhibitors. These include but not limited to insomnia, hot flashes, mood changes, vaginal dryness, bone density loss, and weight gain. We strongly believe that the benefits far outweigh the risks. Patient understands these risks and consented to starting treatment. Planned treatment duration is 5 years.  RTC in 3 months for SCP visit

## 2022-02-21 ENCOUNTER — Encounter (HOSPITAL_COMMUNITY): Payer: Self-pay | Admitting: Surgery

## 2022-02-21 ENCOUNTER — Other Ambulatory Visit: Payer: Self-pay

## 2022-02-22 ENCOUNTER — Other Ambulatory Visit: Payer: Self-pay

## 2022-02-22 ENCOUNTER — Encounter (HOSPITAL_COMMUNITY): Payer: Self-pay | Admitting: Surgery

## 2022-02-22 ENCOUNTER — Ambulatory Visit (HOSPITAL_COMMUNITY)
Admission: RE | Admit: 2022-02-22 | Discharge: 2022-02-22 | Disposition: A | Discharge status: Home / Self Care | Payer: Medicare Other | Attending: Surgery | Admitting: Surgery

## 2022-02-22 ENCOUNTER — Encounter (HOSPITAL_COMMUNITY)
Admission: RE | Disposition: A | Discharge status: Home / Self Care | Payer: Self-pay | Source: Home / Self Care | Attending: Surgery

## 2022-02-22 ENCOUNTER — Ambulatory Visit (HOSPITAL_COMMUNITY): Payer: Medicare Other | Admitting: Anesthesiology

## 2022-02-22 ENCOUNTER — Ambulatory Visit (HOSPITAL_BASED_OUTPATIENT_CLINIC_OR_DEPARTMENT_OTHER): Payer: Medicare Other | Admitting: Anesthesiology

## 2022-02-22 DIAGNOSIS — Z79899 Other long term (current) drug therapy: Secondary | ICD-10-CM | POA: Diagnosis not present

## 2022-02-22 DIAGNOSIS — I10 Essential (primary) hypertension: Secondary | ICD-10-CM | POA: Diagnosis not present

## 2022-02-22 DIAGNOSIS — N6011 Diffuse cystic mastopathy of right breast: Secondary | ICD-10-CM | POA: Diagnosis not present

## 2022-02-22 DIAGNOSIS — C50911 Malignant neoplasm of unspecified site of right female breast: Secondary | ICD-10-CM

## 2022-02-22 DIAGNOSIS — R920 Mammographic microcalcification found on diagnostic imaging of breast: Secondary | ICD-10-CM | POA: Diagnosis not present

## 2022-02-22 HISTORY — DX: Malignant (primary) neoplasm, unspecified: C80.1

## 2022-02-22 HISTORY — PX: RE-EXCISION OF BREAST LUMPECTOMY: SHX6048

## 2022-02-22 LAB — BASIC METABOLIC PANEL
Anion gap: 12 (ref 5–15)
BUN: 15 mg/dL (ref 8–23)
CO2: 23 mmol/L (ref 22–32)
Calcium: 9.9 mg/dL (ref 8.9–10.3)
Chloride: 106 mmol/L (ref 98–111)
Creatinine, Ser: 1.17 mg/dL — ABNORMAL HIGH (ref 0.44–1.00)
GFR, Estimated: 48 mL/min — ABNORMAL LOW (ref >60)
Glucose, Bld: 83 mg/dL (ref 70–99)
Potassium: 4.4 mmol/L (ref 3.5–5.1)
Sodium: 141 mmol/L (ref 135–145)

## 2022-02-22 LAB — CBC
HCT: 49 % — ABNORMAL HIGH (ref 36.0–46.0)
Hemoglobin: 16.2 g/dL — ABNORMAL HIGH (ref 12.0–15.0)
MCH: 34.2 pg — ABNORMAL HIGH (ref 26.0–34.0)
MCHC: 33.1 g/dL (ref 30.0–36.0)
MCV: 103.6 fL — ABNORMAL HIGH (ref 80.0–100.0)
Platelets: 255 10*3/uL (ref 150–400)
RBC: 4.73 MIL/uL (ref 3.87–5.11)
RDW: 11.7 % (ref 11.5–15.5)
WBC: 10.6 10*3/uL — ABNORMAL HIGH (ref 4.0–10.5)
nRBC: 0 % (ref 0.0–0.2)

## 2022-02-22 SURGERY — EXCISION, LESION, BREAST
Anesthesia: General | Site: Breast | Laterality: Right

## 2022-02-22 MED ORDER — ONDANSETRON HCL 4 MG/2ML IJ SOLN
INTRAMUSCULAR | Status: DC | PRN
  Administered 2022-02-22: 4 mg via INTRAVENOUS

## 2022-02-22 MED ORDER — PROPOFOL 10 MG/ML IV BOLUS
INTRAVENOUS | Status: DC | PRN
  Administered 2022-02-22: 50 mg via INTRAVENOUS
  Administered 2022-02-22: 150 mg via INTRAVENOUS

## 2022-02-22 MED ORDER — ACETAMINOPHEN 500 MG PO TABS
1000.0000 mg | ORAL_TABLET | Freq: Once | ORAL | Status: AC
  Administered 2022-02-22: 1000 mg via ORAL
  Filled 2022-02-22: qty 2

## 2022-02-22 MED ORDER — DEXAMETHASONE SODIUM PHOSPHATE 10 MG/ML IJ SOLN
INTRAMUSCULAR | Status: DC | PRN
  Administered 2022-02-22: 5 mg via INTRAVENOUS

## 2022-02-22 MED ORDER — PROPOFOL 500 MG/50ML IV EMUL
INTRAVENOUS | Status: DC | PRN
  Administered 2022-02-22: 20 ug/kg/min via INTRAVENOUS

## 2022-02-22 MED ORDER — CHLORHEXIDINE GLUCONATE CLOTH 2 % EX PADS: 6.0000 | MEDICATED_PAD | Freq: Once | CUTANEOUS | Status: DC

## 2022-02-22 MED ORDER — CEFAZOLIN SODIUM-DEXTROSE 2-4 GM/100ML-% IV SOLN
2.0000 g | INTRAVENOUS | Status: AC
  Administered 2022-02-22: 2 g via INTRAVENOUS
  Filled 2022-02-22: qty 100

## 2022-02-22 MED ORDER — FENTANYL CITRATE (PF) 250 MCG/5ML IJ SOLN
INTRAMUSCULAR | Status: AC
  Filled 2022-02-22: qty 5

## 2022-02-22 MED ORDER — OXYCODONE HCL 5 MG/5ML PO SOLN: 5.0000 mg | Freq: Once | ORAL | Status: AC | PRN

## 2022-02-22 MED ORDER — FENTANYL CITRATE (PF) 100 MCG/2ML IJ SOLN
INTRAMUSCULAR | Status: DC | PRN
  Administered 2022-02-22 (×2): 50 ug via INTRAVENOUS

## 2022-02-22 MED ORDER — OXYCODONE HCL 5 MG PO TABS
ORAL_TABLET | ORAL | Status: AC
  Filled 2022-02-22: qty 1

## 2022-02-22 MED ORDER — LACTATED RINGERS IV SOLN
INTRAVENOUS | Status: DC
Start: 2022-02-22 — End: 2022-02-22

## 2022-02-22 MED ORDER — BUPIVACAINE-EPINEPHRINE (PF) 0.25% -1:200000 IJ SOLN
INTRAMUSCULAR | Status: AC
  Filled 2022-02-22: qty 30

## 2022-02-22 MED ORDER — ONDANSETRON HCL 4 MG/2ML IJ SOLN: 4.0000 mg | Freq: Once | INTRAMUSCULAR | Status: DC | PRN

## 2022-02-22 MED ORDER — LIDOCAINE 2% (20 MG/ML) 5 ML SYRINGE
INTRAMUSCULAR | Status: DC | PRN
  Administered 2022-02-22: 60 mg via INTRAVENOUS

## 2022-02-22 MED ORDER — ENSURE PRE-SURGERY PO LIQD: 296.0000 mL | Freq: Once | ORAL | Status: DC

## 2022-02-22 MED ORDER — FENTANYL CITRATE (PF) 100 MCG/2ML IJ SOLN: 25.0000 ug | INTRAMUSCULAR | Status: DC | PRN

## 2022-02-22 MED ORDER — OXYCODONE HCL 5 MG PO TABS
5.0000 mg | ORAL_TABLET | Freq: Once | ORAL | Status: AC | PRN
  Administered 2022-02-22: 5 mg via ORAL

## 2022-02-22 MED ORDER — BUPIVACAINE-EPINEPHRINE 0.25% -1:200000 IJ SOLN
INTRAMUSCULAR | Status: DC | PRN
  Administered 2022-02-22: 10 mL

## 2022-02-22 MED ORDER — CHLORHEXIDINE GLUCONATE 0.12 % MT SOLN
15.0000 mL | Freq: Once | OROMUCOSAL | Status: AC
  Administered 2022-02-22: 15 mL via OROMUCOSAL
  Filled 2022-02-22: qty 15

## 2022-02-22 MED ORDER — PROPOFOL 10 MG/ML IV BOLUS
INTRAVENOUS | Status: AC
  Filled 2022-02-22: qty 20

## 2022-02-22 MED ORDER — ACETAMINOPHEN 500 MG PO TABS: 1000.0000 mg | ORAL_TABLET | ORAL | Status: DC

## 2022-02-22 MED ORDER — 0.9 % SODIUM CHLORIDE (POUR BTL) OPTIME
TOPICAL | Status: DC | PRN
  Administered 2022-02-22: 1000 mL

## 2022-02-22 MED ORDER — ORAL CARE MOUTH RINSE
15.0000 mL | Freq: Once | OROMUCOSAL | Status: AC
Start: 2022-02-22 — End: 2022-02-22

## 2022-02-22 SURGICAL SUPPLY — 34 items
APPLIER CLIP 9.375 MED OPEN (MISCELLANEOUS) ×2
BAG COUNTER SPONGE SURGICOUNT (BAG) ×2 IMPLANT
BINDER BREAST LRG (GAUZE/BANDAGES/DRESSINGS) IMPLANT
BINDER BREAST XLRG (GAUZE/BANDAGES/DRESSINGS) IMPLANT
CANISTER SUCT 3000ML PPV (MISCELLANEOUS) ×2 IMPLANT
CHLORAPREP W/TINT 26 (MISCELLANEOUS) ×2 IMPLANT
CLIP APPLIE 9.375 MED OPEN (MISCELLANEOUS) ×1 IMPLANT
COVER PROBE W GEL 5X96 (DRAPES) ×2 IMPLANT
COVER SURGICAL LIGHT HANDLE (MISCELLANEOUS) ×2 IMPLANT
DERMABOND ADVANCED (GAUZE/BANDAGES/DRESSINGS) ×1
DERMABOND ADVANCED .7 DNX12 (GAUZE/BANDAGES/DRESSINGS) ×1 IMPLANT
DEVICE DUBIN SPECIMEN MAMMOGRA (MISCELLANEOUS) ×2 IMPLANT
DRAPE CHEST BREAST 15X10 FENES (DRAPES) ×2 IMPLANT
ELECT CAUTERY BLADE 6.4 (BLADE) ×2 IMPLANT
ELECT REM PT RETURN 9FT ADLT (ELECTROSURGICAL) ×2
ELECTRODE REM PT RTRN 9FT ADLT (ELECTROSURGICAL) ×1 IMPLANT
GLOVE ECLIPSE 7.0 STRL STRAW (GLOVE) ×1 IMPLANT
GLOVE SS N UNI LF 7.0 STRL (GLOVE) ×2 IMPLANT
GLOVE SURG SIGNA 7.5 PF LTX (GLOVE) ×2 IMPLANT
GOWN STRL REUS W/ TWL LRG LVL3 (GOWN DISPOSABLE) ×1 IMPLANT
GOWN STRL REUS W/ TWL XL LVL3 (GOWN DISPOSABLE) ×1 IMPLANT
GOWN STRL REUS W/TWL LRG LVL3 (GOWN DISPOSABLE) ×2
GOWN STRL REUS W/TWL XL LVL3 (GOWN DISPOSABLE) ×2
KIT BASIN OR (CUSTOM PROCEDURE TRAY) ×2 IMPLANT
KIT MARKER MARGIN INK (KITS) ×2 IMPLANT
NDL HYPO 25GX1X1/2 BEV (NEEDLE) ×1 IMPLANT
NEEDLE HYPO 25GX1X1/2 BEV (NEEDLE) ×2 IMPLANT
NS IRRIG 1000ML POUR BTL (IV SOLUTION) IMPLANT
PACK GENERAL/GYN (CUSTOM PROCEDURE TRAY) ×2 IMPLANT
SUT MNCRL AB 4-0 PS2 18 (SUTURE) ×2 IMPLANT
SUT VIC AB 3-0 SH 18 (SUTURE) ×3 IMPLANT
SYR CONTROL 10ML LL (SYRINGE) ×2 IMPLANT
TOWEL GREEN STERILE (TOWEL DISPOSABLE) ×2 IMPLANT
TOWEL GREEN STERILE FF (TOWEL DISPOSABLE) ×2 IMPLANT

## 2022-02-22 NOTE — Anesthesia Postprocedure Evaluation (Signed)
Anesthesia Post Note  Patient: Melanie Kirby  Procedure(s) Performed: RE-EXCISION RIGHT BREAST CANCER (Right: Breast)     Patient location during evaluation: PACU Anesthesia Type: General Level of consciousness: awake and alert Pain management: pain level controlled Vital Signs Assessment: post-procedure vital signs reviewed and stable Respiratory status: spontaneous breathing, nonlabored ventilation, respiratory function stable and patient connected to nasal cannula oxygen Cardiovascular status: blood pressure returned to baseline, stable and bradycardic Postop Assessment: no apparent nausea or vomiting Anesthetic complications: no   No notable events documented.  Last Vitals:  Vitals:   02/22/22 1516 02/22/22 1530  BP: (!) 138/98 (!) 154/83  Pulse: (!) 56 (!) 48  Resp: 14 13  Temp:    SpO2: 97% 96%    Last Pain:  Vitals:   02/22/22 1505  PainSc: [place]

## 2022-02-22 NOTE — Interval H&P Note (Signed)
History and Physical Interval Note:no change in H and P  02/22/2022 1:19 PM  Melanie Kirby  has presented today for surgery, with the diagnosis of RIGHT BREAST [place].  The various methods of treatment have been discussed with the patient and family. After consideration of risks, benefits and other options for treatment, the patient has consented to  Procedure(s): RE-EXCISION RIGHT BREAST CANCER (Right) as a surgical intervention.  The patient's history has been reviewed, patient examined, no change in status, stable for surgery.  I have reviewed the patient's chart and labs.  Questions were answered to the patient's satisfaction.     [name]

## 2022-02-22 NOTE — Anesthesia Preprocedure Evaluation (Addendum)
Anesthesia Evaluation  Patient identified by MRN, date of birth, ID band Patient awake    Reviewed: Allergy & Precautions, NPO status , Patient's Chart, lab work & pertinent test results  History of Anesthesia Complications (+) PONV and history of anesthetic complications  Airway Mallampati: II  TM Distance: >3 FB Neck ROM: Full    Dental  (+) Dental Advisory Given, Teeth Intact   Pulmonary neg pulmonary ROS,    Pulmonary exam normal        Cardiovascular hypertension, Pt. on medications Normal cardiovascular exam     Neuro/Psych negative neurological ROS  negative psych ROS   GI/Hepatic negative GI ROS, Neg liver ROS,   Endo/Other  negative endocrine ROS  Renal/GU negative Renal ROS     Musculoskeletal negative musculoskeletal ROS (+)   Abdominal   Peds  Hematology negative hematology ROS (+)   Anesthesia Other Findings   Reproductive/Obstetrics  Breast cancer                             Anesthesia Physical Anesthesia Plan  ASA: 2  Anesthesia Plan: General   Post-op Pain Management: Tylenol PO (pre-op)*   Induction: Intravenous  PONV Risk Score and Plan: 4 or greater and Treatment may vary due to age or medical condition, Ondansetron, Propofol infusion and Dexamethasone  Airway Management Planned: LMA  Additional Equipment: None  Intra-op Plan:   Post-operative Plan: Extubation in OR  Informed Consent: I have reviewed the patients History and Physical, chart, labs and discussed the procedure including the risks, benefits and alternatives for the proposed anesthesia with the patient or authorized representative who has indicated his/her understanding and acceptance.     Dental advisory given  Plan Discussed with: CRNA and Anesthesiologist  Anesthesia Plan Comments:        Anesthesia Quick Evaluation

## 2022-02-22 NOTE — Transfer of Care (Signed)
Immediate Anesthesia Transfer of Care Note  Patient: Melanie Kirby  Procedure(s) Performed: RE-EXCISION RIGHT BREAST CANCER (Right: Breast)  Patient Location: PACU  Anesthesia Type:General  Level of Consciousness: drowsy  Airway & Oxygen Therapy: Patient Spontanous Breathing and Patient connected to face mask oxygen  Post-op Assessment: Report given to RN and Post -op Vital signs reviewed and stable  Post vital signs: Reviewed and stable  Last Vitals:  Vitals Value Taken Time  BP 126/63 02/22/22 1502  Temp    Pulse 61 02/22/22 1504  Resp 11 02/22/22 1504  SpO2 96 % 02/22/22 1504  Vitals shown include unvalidated device data.  Last Pain:  Vitals:   02/22/22 1142  PainSc: 0-No pain      Patients Stated Pain Goal: 1 (29/79/89 2119)  Complications: No notable events documented.

## 2022-02-22 NOTE — Anesthesia Procedure Notes (Addendum)
Procedure Name: LMA Insertion Date/Time: 02/22/2022 2:09 PM  Performed by: Merryl Hacker, RNPre-anesthesia Checklist: Patient identified, Emergency Drugs available, Suction available and Patient being monitored Patient Re-evaluated:Patient Re-evaluated prior to induction Oxygen Delivery Method: Circle System Utilized Preoxygenation: Pre-oxygenation with 100% oxygen Induction Type: IV induction Ventilation: Mask ventilation without difficulty LMA: LMA inserted LMA Size: 4.0 Number of attempts: 1 Airway Equipment and Method: Bite block Placement Confirmation: positive ETCO2 Tube secured with: Tape Dental Injury: Teeth and Oropharynx as per pre-operative assessment  Comments: Performed by Heide Scales, SRNA under direct supervision by Dr. Fransisco Beau and Trixie Deis, CRNA

## 2022-02-22 NOTE — Op Note (Signed)
RE-EXCISION RIGHT BREAST CANCER  Procedure Note  Melanie Kirby 02/22/2022   Pre-op Diagnosis: RIGHT BREAST CANCER POSITIVE MARGIN     Post-op Diagnosis: same  Procedure(s): RE-EXCISION RIGHT BREAST CANCER  Surgeon(s): [name], MD  Anesthesia: General  Staff:  Circulator: Dory Peru, RN; Williams, [place] M, RN Scrub Person: Redge Gainer, RN  Estimated Blood Loss: Minimal  Indications: This is a 76 year old female had undergone a radioactive seed guided right breast lumpectomy for invasive ductal carcinoma.  The final pathology showed the margins were negative regarding the invasive ductal carcinoma but there was a positive posterior margin.  The decision was made to reexcise the entire area in hopes of avoiding radiation therapy.                  Specimens: Previous right lumpectomy site was excised completely and marked with marker paint and sent to pathology.  The specimen was taken all the way down to the chest wall so no further posterior margin can be taken in the future  Procedure: The patient brought to operating identifies correct patient.  She is placed upon the operating table and general anesthesia was induced.  The patient's right breast was prepped and draped in usual sterile fashion.  I anesthetized skin at the previous lumpectomy site in the lower inner quadrant of the right breast with Marcaine.  I then performed elliptical incision with a scalpel and excised the previous incision with the cautery.  I then identified the lumpectomy cavity and suctioned out the fluid.  I then excised the lumpectomy cavity circumferentially with electrocautery going all the way down to the chest wall.  Again, no further posterior margin can be taken.  Once the lumpectomy cavity was removed I then marked all margins with paint.  The specimen was sent to pathology for evaluation.  I achieved hemostasis with the cautery.  I anesthetized the incision further with  Marcaine.  I then closed the deep tissue with interrupted 3-0 Vicryl sutures.  I then closed subcutaneous tissue with interrupted 3-0 Vicryl sutures and closed skin with a running 4-0 Monocryl.  Dermabond was then applied.  The patient was then placed in a breast binder.  All counts were correct at the end of the procedure.  She was then extubated in the operating room and taken in stable condition to the recovery room.          [name]   Date: 02/22/2022  Time: 2:50 PM

## 2022-02-22 NOTE — Discharge Instructions (Signed)
[place] Office Phone Number [phone]  BREAST BIOPSY/ PARTIAL MASTECTOMY: POST OP INSTRUCTIONS  Always review your discharge instruction sheet given to you by the facility where your surgery was performed.  IF YOU HAVE DISABILITY OR FAMILY LEAVE FORMS, YOU MUST BRING THEM TO THE OFFICE FOR PROCESSING.  DO NOT GIVE THEM TO YOUR DOCTOR.  A prescription for pain medication may be given to you upon discharge.  Take your pain medication as prescribed, if needed.  If narcotic pain medicine is not needed, then you may take acetaminophen (Tylenol) or ibuprofen (Advil) as needed. Take your usually prescribed medications unless otherwise directed If you need a refill on your pain medication, please contact your pharmacy.  They will contact our office to request authorization.  Prescriptions will not be filled after 5pm or on week-ends. You should eat very light the first 24 hours after surgery, such as soup, crackers, pudding, etc.  Resume your normal diet the day after surgery. Most patients will experience some swelling and bruising in the breast.  Ice packs and a good support bra will help.  Swelling and bruising can take several days to resolve.  It is common to experience some constipation if taking pain medication after surgery.  Increasing fluid intake and taking a stool softener will usually help or prevent this problem from occurring.  A mild laxative (Milk of Magnesia or Miralax) should be taken according to package directions if there are no bowel movements after 48 hours. Unless discharge instructions indicate otherwise, you may remove your bandages 24-48 hours after surgery, and you may shower at that time.  You may have steri-strips (small skin tapes) in place directly over the incision.  These strips should be left on the skin for 7-10 days.  If your surgeon used skin glue on the incision, you may shower in 24 hours.  The glue will flake off over the next 2-3 weeks.  Any  sutures or staples will be removed at the office during your follow-up visit. ACTIVITIES:  You may resume regular daily activities (gradually increasing) beginning the next day.  Wearing a good support bra or sports bra minimizes pain and swelling.  You may have sexual intercourse when it is comfortable. You may drive when you no longer are taking prescription pain medication, you can comfortably wear a seatbelt, and you can safely maneuver your car and apply brakes. RETURN TO WORK:  ______________________________________________________________________________________ Dennis Bast should see your doctor in the office for a follow-up appointment approximately two weeks after your surgery.  Your doctor's nurse will typically make your follow-up appointment when she calls you with your pathology report.  Expect your pathology report 2-3 business days after your surgery.  You may call to check if you do not hear from [place] after three days. OTHER INSTRUCTIONS: OK TO REMOVE THE BINDER AND SHOWER STARTING TOMORROW_______________________________________________________________________________________________ _____________________________________________________________________________________________________________________________________ _____________________________________________________________________________________________________________________________________ _____________________________________________________________________________________________________________________________________  WHEN TO CALL YOUR DOCTOR: Fever over 101.0 Nausea and/or vomiting. Extreme swelling or bruising. Continued bleeding from incision. Increased pain, redness, or drainage from the incision.  The clinic staff is available to answer your questions during regular business hours.  Please don't hesitate to call and ask to speak to one of the nurses for clinical concerns.  If you have a medical emergency, go to the nearest  emergency room or call 911.  A surgeon from [facility] is always on call at the hospital.  For further questions, please visit [email or web address]

## 2022-02-22 NOTE — Anesthesia Procedure Notes (Deleted)
Procedure Name: LMA Insertion Date/Time: 02/22/2022 2:09 PM  Performed by: Leonor Liv, CRNAPre-anesthesia Checklist: Patient identified, Emergency Drugs available, Suction available and Patient being monitored Patient Re-evaluated:Patient Re-evaluated prior to induction Oxygen Delivery Method: Circle System Utilized Preoxygenation: Pre-oxygenation with 100% oxygen Induction Type: IV induction Ventilation: Mask ventilation without difficulty LMA: LMA inserted LMA Size: 4.0 Number of attempts: 1 Placement Confirmation: positive ETCO2 Tube secured with: Tape Dental Injury: Teeth and Oropharynx as per pre-operative assessment

## 2022-02-23 ENCOUNTER — Encounter (HOSPITAL_COMMUNITY): Payer: Self-pay | Admitting: Surgery

## 2022-02-24 ENCOUNTER — Encounter (HOSPITAL_COMMUNITY): Payer: Self-pay

## 2022-02-24 LAB — SURGICAL PATHOLOGY

## 2022-03-03 ENCOUNTER — Encounter: Payer: Self-pay | Admitting: *Deleted

## 2022-03-03 DIAGNOSIS — Z170 Estrogen receptor positive status [ER+]: Secondary | ICD-10-CM

## 2022-03-13 ENCOUNTER — Telehealth: Payer: Self-pay | Admitting: Radiation Oncology

## 2022-03-13 NOTE — Telephone Encounter (Signed)
I called to the let the patient know her re-excision was clear of cancer and that with this, Dr. Lisbeth Renshaw is in agreement that she may forgo radiation as she desires. I encouraged her to call and let us know if she has questions or concerns she would like to review.

## 2022-03-24 DIAGNOSIS — I10 Essential (primary) hypertension: Secondary | ICD-10-CM | POA: Insufficient documentation

## 2022-03-24 DIAGNOSIS — M8580 Other specified disorders of bone density and structure, unspecified site: Secondary | ICD-10-CM | POA: Insufficient documentation

## 2022-03-24 DIAGNOSIS — R809 Proteinuria, unspecified: Secondary | ICD-10-CM | POA: Insufficient documentation

## 2022-03-24 DIAGNOSIS — M25519 Pain in unspecified shoulder: Secondary | ICD-10-CM | POA: Insufficient documentation

## 2022-03-24 DIAGNOSIS — Z86010 Personal history of colonic polyps: Secondary | ICD-10-CM | POA: Insufficient documentation

## 2022-04-28 ENCOUNTER — Ambulatory Visit
Admission: RE | Admit: 2022-04-28 | Discharge: 2022-04-28 | Disposition: A | Discharge status: Home / Self Care | Payer: Medicare Other | Source: Ambulatory Visit | Attending: Family Medicine | Admitting: Family Medicine

## 2022-04-28 DIAGNOSIS — Z780 Asymptomatic menopausal state: Secondary | ICD-10-CM | POA: Diagnosis not present

## 2022-04-28 DIAGNOSIS — M8588 Other specified disorders of bone density and structure, other site: Secondary | ICD-10-CM

## 2022-04-28 DIAGNOSIS — M8589 Other specified disorders of bone density and structure, multiple sites: Secondary | ICD-10-CM | POA: Diagnosis not present

## 2022-05-14 NOTE — Progress Notes (Unsigned)
SURVIVORSHIP VISIT:   BRIEF ONCOLOGIC HISTORY:  Oncology History  Malignant neoplasm of lower-inner quadrant of right female breast ([place])  12/23/2021 Initial Diagnosis   Screening mammogram detected right breast mass 0.7 cm at 4 o'clock position.  Axilla negative, biopsy revealed grade 2 IDC ER 95%, PR 95%, HER2 equivocal by IHC, FISH pending, Ki-67 less than 5%   02/01/2022 Surgery   A. BREAST, RIGHT, LUMPECTOMY:  Invasive ductal carcinoma.  Ductal carcinoma in situ.  See surgical pathology cancer case summary for details.  INVASIVE CARCINOMA OF THE BREAST:  Resection    02/14/2022 -  Anti-estrogen oral therapy   Letrozole x 5 years     INTERVAL HISTORY:  Melanie Kirby to review her survivorship care plan detailing her treatment course for breast cancer, as well as monitoring long-term side effects of that treatment, education regarding health maintenance, screening, and overall wellness and health promotion.     Overall, Melanie Kirby reports feeling quite well.  She is taking letrozole daily with good tolerance.  REVIEW OF SYSTEMS:  Review of Systems  Constitutional:  Negative for appetite change, chills, fatigue, fever and unexpected weight change.  HENT:   Negative for hearing loss, lump/mass and trouble swallowing.   Eyes:  Negative for eye problems and icterus.  Respiratory:  Negative for chest tightness, cough and shortness of breath.   Cardiovascular:  Negative for chest pain, leg swelling and palpitations.  Gastrointestinal:  Negative for abdominal distention, abdominal pain, constipation, diarrhea, nausea and vomiting.  Endocrine: Negative for hot flashes.  Genitourinary:  Negative for difficulty urinating.   Musculoskeletal:  Negative for arthralgias.  Skin:  Negative for itching and rash.  Neurological:  Negative for dizziness, extremity weakness, headaches and numbness.  Hematological:  Negative for adenopathy. Does not bruise/bleed easily.   Psychiatric/Behavioral:  Negative for depression. The patient is not nervous/anxious.   Breast: Denies any new nodularity, masses, tenderness, nipple changes, or nipple discharge.      ONCOLOGY TREATMENT TEAM:  1. Surgeon:  Dr. Ninfa Linden at [facility] 2. Medical Oncologist: Dr. Lindi Adie  3. Radiation Oncologist: Dr. Lisbeth Renshaw    PAST MEDICAL/SURGICAL HISTORY:  Past Medical History:  Diagnosis Date   Cancer [facility])    Right Breast   Complication of anesthesia    Diverticulosis    Family history of breast cancer 01/04/2022   Family history of melanoma 01/04/2022   High cholesterol    Hypertension    PONV (postoperative nausea and vomiting)    Past Surgical History:  Procedure Laterality Date   ABDOMINAL HYSTERECTOMY     BREAST LUMPECTOMY WITH RADIOACTIVE SEED LOCALIZATION Right 02/01/2022   Procedure: RIGHT BREAST LUMPECTOMY WITH RADIOACTIVE SEED LOCALIZATION;  Surgeon: [name], MD;  Location: [place];  Service: General;  Laterality: Right;   RE-EXCISION OF BREAST LUMPECTOMY Right 02/22/2022   Procedure: RE-EXCISION RIGHT BREAST CANCER;  Surgeon: [name], MD;  Location: [place];  Service: General;  Laterality: Right;     ALLERGIES:  Allergies  Allergen Reactions   Allopurinol Other (See Comments)    Can not take due to kidney function   Pravachol [Pravastatin]     Did not agree with her     CURRENT MEDICATIONS:  Outpatient Encounter Medications as of 05/15/2022  Medication Sig Note   benazepril (LOTENSIN) 40 MG tablet Take 40 mg by mouth in the morning.    colchicine 0.6 MG tablet Take one capsule daily for maintenance or as needed for gout flares (Patient taking differently: Take 0.6  mg by mouth in the morning.)    letrozole (FEMARA) 2.5 MG tablet Take 1 tablet (2.5 mg total) by mouth daily. 02/14/2022: New Med - Starting after February 28, 2022    Multiple Vitamin (MULTIVITAMINS PO) Take 1 tablet by mouth daily. 02/14/2022: ON HOLD due to procedure     Omega-3 Fatty Acids (FISH OIL PO) Take 1 capsule by mouth daily.    simvastatin (ZOCOR) 40 MG tablet Take 40 mg by mouth daily in the afternoon.    No facility-administered encounter medications on file as of 05/15/2022.     ONCOLOGIC FAMILY HISTORY:  Family History  Problem Relation Age of Onset   Melanoma Sister        dx 45s; metastatic   Lung cancer Maternal Grandfather    Breast cancer Niece        dx 37s    SOCIAL HISTORY:  Social History   Socioeconomic History   Marital status: Married    Spouse name: Not on file   Number of children: Not on file   Years of education: Not on file   Highest education level: Not on file  Occupational History   Not on file  Tobacco Use   Smoking status: Never   Smokeless tobacco: Never  Vaping Use   Vaping Use: Never used  Substance and Sexual Activity   Alcohol use: Never   Drug use: Never   Sexual activity: Not on file  Other Topics Concern   Not on file  Social History Narrative   Not on file   Social Determinants of Health   Financial Resource Strain: Not on file  Food Insecurity: Not on file  Transportation Needs: Not on file  Physical Activity: Not on file  Stress: Not on file  Social Connections: Not on file  Intimate Partner Violence: Not on file     OBSERVATIONS/OBJECTIVE:  BP 123/67 (BP Location: Left Arm, Patient Position: Sitting)   Pulse 67   Temp 97.9 F (36.6 C) (Temporal)   Resp 16   Ht '5\' 5"'  (1.651 m)   Wt 162 lb 3.2 oz (73.6 kg)   SpO2 97%   BMI 26.99 kg/m  GENERAL: Patient is a well appearing female in no acute distress HEENT:  Sclerae anicteric.  Oropharynx clear and moist. No ulcerations or evidence of oropharyngeal candidiasis. Neck is supple.  NODES:  No cervical, supraclavicular, or axillary lymphadenopathy palpated.  BREAST EXAM:  right breast s/p lumpectomy, no signs of local recurrence, left breast benign LUNGS:  Clear to auscultation bilaterally.  No wheezes or rhonchi. HEART:   Regular rate and rhythm. No murmur appreciated. ABDOMEN:  Soft, nontender.  Positive, normoactive bowel sounds. No organomegaly palpated. MSK:  No focal spinal tenderness to palpation. Full range of motion bilaterally in the upper extremities. EXTREMITIES:  No peripheral edema.   SKIN:  Clear with no obvious rashes or skin changes. No nail dyscrasia. NEURO:  Nonfocal. Well oriented.  Appropriate affect.  LABORATORY DATA:  None for this visit.  DIAGNOSTIC IMAGING:  None for this visit.      ASSESSMENT AND PLAN:  Ms.. Ozga is a pleasant 76 y.o. female with Stage IA right breast invasive ductal carcinoma, ER+/PR+/HER2-, diagnosed in 11/2021, treated with lumpectomy and anti-estrogen therapy with Letrozole beginning in 01/2022.  She presents to the Survivorship Clinic for our initial meeting and routine follow-up post-completion of treatment for breast cancer.    1. Stage IA right breast cancer:  Ms. Monds is continuing to recover from  definitive treatment for breast cancer. She will follow-up with her medical oncologist, Dr. Lindi Adie in 6 months with history and physical exam per surveillance protocol.  She will continue her anti-estrogen therapy with Letrozole. Thus far, she is tolerating the Letrozole well, with minimal side effects. She was instructed to make Dr. Lindi Adie or myself aware if she begins to experience any worsening side effects of the medication and I could see her back in clinic to help manage those side effects, as needed. Her mammogram is due 11/2022; orders placed today.   Today, a comprehensive survivorship care plan and treatment summary was reviewed with the patient today detailing her breast cancer diagnosis, treatment course, potential late/long-term effects of treatment, appropriate follow-up care with recommendations for the future, and patient education resources.  A copy of this summary, along with a letter will be sent to the patient's primary care provider  via mail/fax/In Basket message after today's visit.    2.  Advance care planning: We discussed this in detail today.  Melanie Kirby let me know that she does not have any advance care plans and does not wish to complete these she says she will let her husband figure it out.  3. Bone health:  Given Ms. Dyk's age/history of breast cancer and her current treatment regimen including anti-estrogen therapy with Letrozole, she is at risk for bone demineralization.  Her last DEXA scan was 04/28/2022, which showed osteopenia with a t score of -2.0 in the forearm.  She should repeat testing in 2 years.  She was given education on specific activities to promote bone health.  4. Cancer screening:  Due to Ms. Vanderhoff's history and her age, she should receive screening for skin cancers, colon cancer.  The information and recommendations are listed on the patient's comprehensive care plan/treatment summary and were reviewed in detail with the patient.    5. Health maintenance and wellness promotion: Ms. Dula was encouraged to consume 5-7 servings of fruits and vegetables per day. We reviewed the "Nutrition Rainbow" handout.  She was also encouraged to engage in moderate to vigorous exercise for 30 minutes per day most days of the week. We discussed the [employer] fitness program, which is designed for cancer survivors to help them become more physically fit after cancer treatments.  She was instructed to limit her alcohol consumption and continue to abstain from tobacco use.     6. Support services/counseling: It is not uncommon for this period of the patient's cancer care trajectory to be one of many emotions and stressors.   She was given information regarding our available services and encouraged to contact me with any questions or for help enrolling in any of our support group/programs.    Follow up instructions:    -Return to cancer center in 09/2022 f/u with Dr. Lindi Adie  -Mammogram due in  11/2022 -Bone density testing due in September 2025 -Follow up with surgery 03/2023 -She is welcome to return back to the Survivorship Clinic at any time; no additional follow-up needed at this time.  -Consider referral back to survivorship as a long-term survivor for continued surveillance  The patient was provided an opportunity to ask questions and all were answered. The patient agreed with the plan and demonstrated an understanding of the instructions.   Total encounter time:30 minutes*in face-to-face visit time, chart review, lab review, care coordination, order entry, and documentation of the encounter time.    Wilber Bihari, NP 05/15/22 10:34 AM Medical Oncology and Hematology [facility]  [place], [place] [address] Tel. [phone]    Fax. [phone]  *Total Encounter Time as defined by the Centers for Medicare and Medicaid Services includes, in addition to the face-to-face time of a patient visit (documented in the note above) non-face-to-face time: obtaining and reviewing outside history, ordering and reviewing medications, tests or procedures, care coordination (communications with other health care professionals or caregivers) and documentation in the medical record.

## 2022-05-15 ENCOUNTER — Encounter: Payer: Self-pay | Admitting: Adult Health

## 2022-05-15 ENCOUNTER — Inpatient Hospital Stay: Payer: Medicare Other | Attending: Adult Health | Admitting: Adult Health

## 2022-05-15 ENCOUNTER — Other Ambulatory Visit: Payer: Self-pay

## 2022-05-15 VITALS — BP 123/67 | HR 67 | Temp 97.9°F | Resp 16 | Ht 65.0 in | Wt 162.2 lb

## 2022-05-15 DIAGNOSIS — Z79899 Other long term (current) drug therapy: Secondary | ICD-10-CM | POA: Insufficient documentation

## 2022-05-15 DIAGNOSIS — I10 Essential (primary) hypertension: Secondary | ICD-10-CM | POA: Diagnosis not present

## 2022-05-15 DIAGNOSIS — Z803 Family history of malignant neoplasm of breast: Secondary | ICD-10-CM | POA: Diagnosis not present

## 2022-05-15 DIAGNOSIS — E7800 Pure hypercholesterolemia, unspecified: Secondary | ICD-10-CM | POA: Diagnosis not present

## 2022-05-15 DIAGNOSIS — M8580 Other specified disorders of bone density and structure, unspecified site: Secondary | ICD-10-CM | POA: Insufficient documentation

## 2022-05-15 DIAGNOSIS — Z79811 Long term (current) use of aromatase inhibitors: Secondary | ICD-10-CM | POA: Insufficient documentation

## 2022-05-15 DIAGNOSIS — Z170 Estrogen receptor positive status [ER+]: Secondary | ICD-10-CM | POA: Diagnosis not present

## 2022-05-15 DIAGNOSIS — C50311 Malignant neoplasm of lower-inner quadrant of right female breast: Secondary | ICD-10-CM | POA: Insufficient documentation

## 2022-05-15 DIAGNOSIS — Z808 Family history of malignant neoplasm of other organs or systems: Secondary | ICD-10-CM | POA: Insufficient documentation

## 2022-05-15 DIAGNOSIS — Z801 Family history of malignant neoplasm of trachea, bronchus and lung: Secondary | ICD-10-CM | POA: Insufficient documentation

## 2022-05-16 ENCOUNTER — Telehealth: Payer: Self-pay | Admitting: Hematology and Oncology

## 2022-05-16 NOTE — Telephone Encounter (Signed)
Talked with patient confirming 2/21 appointment with Dr.Gudena

## 2022-05-17 ENCOUNTER — Encounter: Payer: Medicare Other | Admitting: Adult Health

## 2022-05-17 DIAGNOSIS — M109 Gout, unspecified: Secondary | ICD-10-CM | POA: Diagnosis not present

## 2022-05-17 DIAGNOSIS — E7800 Pure hypercholesterolemia, unspecified: Secondary | ICD-10-CM | POA: Diagnosis not present

## 2022-05-17 DIAGNOSIS — I10 Essential (primary) hypertension: Secondary | ICD-10-CM | POA: Diagnosis not present

## 2022-05-17 DIAGNOSIS — M8580 Other specified disorders of bone density and structure, unspecified site: Secondary | ICD-10-CM | POA: Diagnosis not present

## 2022-05-17 DIAGNOSIS — R809 Proteinuria, unspecified: Secondary | ICD-10-CM | POA: Diagnosis not present

## 2022-05-17 DIAGNOSIS — Z23 Encounter for immunization: Secondary | ICD-10-CM | POA: Diagnosis not present

## 2022-07-08 DIAGNOSIS — Z0130 Encounter for examination of blood pressure without abnormal findings: Secondary | ICD-10-CM | POA: Diagnosis not present

## 2022-07-08 DIAGNOSIS — U071 COVID-19: Secondary | ICD-10-CM | POA: Diagnosis not present

## 2022-07-26 DIAGNOSIS — M79605 Pain in left leg: Secondary | ICD-10-CM | POA: Diagnosis not present

## 2022-07-31 DIAGNOSIS — H5213 Myopia, bilateral: Secondary | ICD-10-CM | POA: Diagnosis not present

## 2022-07-31 DIAGNOSIS — H52223 Regular astigmatism, bilateral: Secondary | ICD-10-CM | POA: Diagnosis not present

## 2022-07-31 DIAGNOSIS — H25813 Combined forms of age-related cataract, bilateral: Secondary | ICD-10-CM | POA: Diagnosis not present

## 2022-07-31 DIAGNOSIS — H524 Presbyopia: Secondary | ICD-10-CM | POA: Diagnosis not present

## 2022-09-07 ENCOUNTER — Ambulatory Visit: Payer: Medicare Other | Admitting: Podiatry

## 2022-09-07 ENCOUNTER — Encounter: Payer: Self-pay | Admitting: Podiatry

## 2022-09-07 VITALS — BP 157/82 | HR 61

## 2022-09-07 DIAGNOSIS — M109 Gout, unspecified: Secondary | ICD-10-CM

## 2022-09-07 MED ORDER — COLCHICINE 0.6 MG PO TABS: ORAL_TABLET | ORAL | 3 refills | Status: DC

## 2022-09-07 NOTE — Progress Notes (Signed)
She presents today for foot exam.  She states that she has been out of colchicine.  States that she has had breast cancer with full excision of the mass is cancer free currently.  Has no problem with her feet at all.  Objective: Vital signs are stable she is alert and oriented x 3 pulses are palpable.  Neurologic sensorium is intact Deetjen reflexes are intact muscle strength is normal and symmetrical bilateral.  Cutaneous evaluation demonstrates supple well-hydrated.  There is no erythema edema cellulitis drainage or odor nail plates appear to be normal.  Assessment normal rectus foot noncomplicated.  Plan: Follow-up with me in 1 year for med check foot check.

## 2022-10-13 ENCOUNTER — Telehealth: Payer: Self-pay | Admitting: Hematology and Oncology

## 2022-10-13 NOTE — Telephone Encounter (Signed)
Rescheduled appointment per provider [facility]. Left voicemail. 

## 2022-10-18 ENCOUNTER — Inpatient Hospital Stay: Payer: Medicare Other | Admitting: Hematology and Oncology

## 2022-10-19 NOTE — Progress Notes (Signed)
Patient Care Team: Alroy Dust, L.Marlou Sa, MD as PCP - General (Family Medicine) [name], MD as Consulting Physician (General Surgery) Nicholas Lose, MD as Consulting Physician (Hematology and Oncology) Kyung Rudd, MD as Consulting Physician (Radiation Oncology)  DIAGNOSIS: No diagnosis found.  SUMMARY OF ONCOLOGIC HISTORY: Oncology History  Malignant neoplasm of lower-inner quadrant of right female breast ([place])  12/23/2021 Initial Diagnosis   Screening mammogram detected right breast mass 0.7 cm at 4 o'clock position.  Axilla negative, biopsy revealed grade 2 IDC ER 95%, PR 95%, HER2 equivocal by IHC, FISH pending, Ki-67 less than 5%   02/01/2022 Surgery   A. BREAST, RIGHT, LUMPECTOMY:  Invasive ductal carcinoma.  Ductal carcinoma in situ.  See surgical pathology cancer case summary for details.  INVASIVE CARCINOMA OF THE BREAST:  Resection    02/14/2022 -  Anti-estrogen oral therapy   Letrozole x 5 years     CHIEF COMPLIANT:   INTERVAL HISTORY: Melanie Kirby is a  77 y.o. female is here because of recent diagnosis of right breast cancer. She presents to the clinic today for a follow-up.   ALLERGIES:  is allergic to allopurinol and pravachol [pravastatin].  MEDICATIONS:  Current Outpatient Medications  Medication Sig Dispense Refill   benazepril (LOTENSIN) 40 MG tablet Take 40 mg by mouth in the morning.     colchicine 0.6 MG tablet Take 1 tablet TID until GI upset or flare subsides, then for maintenance, take 1 tablet daily. 90 tablet 3   letrozole (FEMARA) 2.5 MG tablet Take 1 tablet (2.5 mg total) by mouth daily. 90 tablet 3   Multiple Vitamin (MULTIVITAMINS PO) Take 1 tablet by mouth daily.     Omega-3 Fatty Acids (FISH OIL PO) Take 1 capsule by mouth daily.     simvastatin (ZOCOR) 40 MG tablet Take 40 mg by mouth daily in the afternoon.     No current facility-administered medications for this visit.    PHYSICAL EXAMINATION: ECOG PERFORMANCE  STATUS: {CHL ONC ECOG [id number]}  There were no vitals filed for this visit. There were no vitals filed for this visit.  BREAST:*** No palpable masses or nodules in either right or left breasts. No palpable axillary supraclavicular or infraclavicular adenopathy no breast tenderness or nipple discharge. (exam performed in the presence of a chaperone)  LABORATORY DATA:  I have reviewed the data as listed    Latest Ref Rng & Units 02/22/2022   12:00 PM 01/04/2022   11:59 AM  CMP  Glucose 70 - 99 mg/dL 83  94   BUN 8 - 23 mg/dL 15  25   Creatinine 0.44 - 1.00 mg/dL 1.17  1.35   Sodium 135 - 145 mmol/L 141  140   Potassium 3.5 - 5.1 mmol/L 4.4  4.5   Chloride 98 - 111 mmol/L 106  104   CO2 22 - 32 mmol/L 23  29   Calcium 8.9 - 10.3 mg/dL 9.9  10.0   Total Protein 6.5 - 8.1 g/dL  7.8   Total Bilirubin 0.3 - 1.2 mg/dL  0.7   Alkaline Phos 38 - 126 U/L  81   AST 15 - 41 U/L  21   ALT 0 - 44 U/L  21     Lab Results  Component Value Date   WBC 10.6 (H) 02/22/2022   HGB 16.2 (H) 02/22/2022   HCT 49.0 (H) 02/22/2022   MCV 103.6 (H) 02/22/2022   PLT 255 02/22/2022   NEUTROABS 5.0 01/04/2022  ASSESSMENT & PLAN:  No problem-specific Assessment & Plan notes found for this encounter.    No orders of the defined types were placed in this encounter.  The patient has a good understanding of the overall plan. she agrees with it. she will call with any problems that may develop before the next visit here. Total time spent: 30 mins including face to face time and time spent for planning, charting and co-ordination of care   Suzzette Righter, [place] 10/19/22    I Gardiner Coins am acting as a Education administrator for [employer]  ***

## 2022-10-24 ENCOUNTER — Inpatient Hospital Stay: Payer: Medicare Other | Attending: Hematology and Oncology | Admitting: Hematology and Oncology

## 2022-10-24 ENCOUNTER — Other Ambulatory Visit: Payer: Self-pay

## 2022-10-24 VITALS — BP 142/74 | HR 65 | Temp 98.1°F | Resp 18 | Wt 162.6 lb

## 2022-10-24 DIAGNOSIS — C50311 Malignant neoplasm of lower-inner quadrant of right female breast: Secondary | ICD-10-CM

## 2022-10-24 DIAGNOSIS — Z79899 Other long term (current) drug therapy: Secondary | ICD-10-CM | POA: Diagnosis not present

## 2022-10-24 DIAGNOSIS — Z79811 Long term (current) use of aromatase inhibitors: Secondary | ICD-10-CM | POA: Diagnosis not present

## 2022-10-24 DIAGNOSIS — Z170 Estrogen receptor positive status [ER+]: Secondary | ICD-10-CM | POA: Diagnosis not present

## 2022-10-24 MED ORDER — LETROZOLE 2.5 MG PO TABS: 2.5000 mg | ORAL_TABLET | Freq: Every day | ORAL | 3 refills | Status: DC

## 2022-10-24 NOTE — Assessment & Plan Note (Addendum)
12/23/2021:Screening mammogram detected right breast mass 0.7 cm at 4 o'clock position.  Axilla negative, biopsy revealed grade 2 IDC ER 95%, PR 95%, HER2 equivocal by IHC, FISH pending, Ki-67 less than 5%   02/01/22: Rt Lumpectomy: Grade 1 IDC with DCIS 1 cm size, ER 95%, PR 95%, Her 2 Neg, Ki 67:<5% 02/22/2022: Margin reexcision: Benign  Current treatment: Letrozole started 02/14/2022 Letrozole toxicities: Tolerating it extremely well without any problems.  Numbness of the tips of the fingers of right hand: Patient is going to check if it is carpal tunnel.  Breast cancer surveillance: Breast exam 10/24/2022: Benign Mammogram scheduled for 12/11/2022  Return to clinic in 1 year for follow-up

## 2022-11-22 DIAGNOSIS — K219 Gastro-esophageal reflux disease without esophagitis: Secondary | ICD-10-CM | POA: Diagnosis not present

## 2022-11-22 DIAGNOSIS — N1832 Chronic kidney disease, stage 3b: Secondary | ICD-10-CM | POA: Diagnosis not present

## 2022-11-22 DIAGNOSIS — Z0000 Encounter for general adult medical examination without abnormal findings: Secondary | ICD-10-CM | POA: Diagnosis not present

## 2022-11-22 DIAGNOSIS — I10 Essential (primary) hypertension: Secondary | ICD-10-CM | POA: Diagnosis not present

## 2022-11-22 DIAGNOSIS — M8588 Other specified disorders of bone density and structure, other site: Secondary | ICD-10-CM | POA: Diagnosis not present

## 2022-11-22 DIAGNOSIS — C50911 Malignant neoplasm of unspecified site of right female breast: Secondary | ICD-10-CM | POA: Diagnosis not present

## 2022-11-22 DIAGNOSIS — R809 Proteinuria, unspecified: Secondary | ICD-10-CM | POA: Diagnosis not present

## 2022-11-22 DIAGNOSIS — E7800 Pure hypercholesterolemia, unspecified: Secondary | ICD-10-CM | POA: Diagnosis not present

## 2022-11-22 DIAGNOSIS — G5600 Carpal tunnel syndrome, unspecified upper limb: Secondary | ICD-10-CM | POA: Diagnosis not present

## 2022-12-11 ENCOUNTER — Ambulatory Visit
Admission: RE | Admit: 2022-12-11 | Discharge: 2022-12-11 | Disposition: A | Discharge status: Home / Self Care | Payer: Medicare Other | Source: Ambulatory Visit | Attending: Adult Health | Admitting: Adult Health

## 2022-12-11 DIAGNOSIS — Z853 Personal history of malignant neoplasm of breast: Secondary | ICD-10-CM | POA: Diagnosis not present

## 2022-12-11 DIAGNOSIS — R922 Inconclusive mammogram: Secondary | ICD-10-CM | POA: Diagnosis not present

## 2022-12-11 DIAGNOSIS — Z170 Estrogen receptor positive status [ER+]: Secondary | ICD-10-CM

## 2023-04-01 DIAGNOSIS — L255 Unspecified contact dermatitis due to plants, except food: Secondary | ICD-10-CM | POA: Diagnosis not present

## 2023-04-17 DIAGNOSIS — I9589 Other hypotension: Secondary | ICD-10-CM | POA: Diagnosis not present

## 2023-04-17 DIAGNOSIS — I959 Hypotension, unspecified: Secondary | ICD-10-CM | POA: Diagnosis not present

## 2023-04-17 DIAGNOSIS — I10 Essential (primary) hypertension: Secondary | ICD-10-CM | POA: Diagnosis not present

## 2023-04-27 DIAGNOSIS — H5213 Myopia, bilateral: Secondary | ICD-10-CM | POA: Diagnosis not present

## 2023-04-27 DIAGNOSIS — H04123 Dry eye syndrome of bilateral lacrimal glands: Secondary | ICD-10-CM | POA: Diagnosis not present

## 2023-04-27 DIAGNOSIS — H524 Presbyopia: Secondary | ICD-10-CM | POA: Diagnosis not present

## 2023-04-27 DIAGNOSIS — H2513 Age-related nuclear cataract, bilateral: Secondary | ICD-10-CM | POA: Diagnosis not present

## 2023-04-27 DIAGNOSIS — H25013 Cortical age-related cataract, bilateral: Secondary | ICD-10-CM | POA: Diagnosis not present

## 2023-04-27 DIAGNOSIS — H0100A Unspecified blepharitis right eye, upper and lower eyelids: Secondary | ICD-10-CM | POA: Diagnosis not present

## 2023-04-27 DIAGNOSIS — H0100B Unspecified blepharitis left eye, upper and lower eyelids: Secondary | ICD-10-CM | POA: Diagnosis not present

## 2023-04-27 DIAGNOSIS — H43813 Vitreous degeneration, bilateral: Secondary | ICD-10-CM | POA: Diagnosis not present

## 2023-05-04 DIAGNOSIS — D72829 Elevated white blood cell count, unspecified: Secondary | ICD-10-CM | POA: Diagnosis not present

## 2023-05-23 DIAGNOSIS — E7800 Pure hypercholesterolemia, unspecified: Secondary | ICD-10-CM | POA: Diagnosis not present

## 2023-05-23 DIAGNOSIS — I10 Essential (primary) hypertension: Secondary | ICD-10-CM | POA: Diagnosis not present

## 2023-05-23 DIAGNOSIS — C50311 Malignant neoplasm of lower-inner quadrant of right female breast: Secondary | ICD-10-CM | POA: Diagnosis not present

## 2023-05-23 DIAGNOSIS — M8588 Other specified disorders of bone density and structure, other site: Secondary | ICD-10-CM | POA: Diagnosis not present

## 2023-05-23 DIAGNOSIS — N1832 Chronic kidney disease, stage 3b: Secondary | ICD-10-CM | POA: Diagnosis not present

## 2023-05-23 DIAGNOSIS — R809 Proteinuria, unspecified: Secondary | ICD-10-CM | POA: Diagnosis not present

## 2023-05-23 DIAGNOSIS — K219 Gastro-esophageal reflux disease without esophagitis: Secondary | ICD-10-CM | POA: Diagnosis not present

## 2023-05-24 DIAGNOSIS — H2512 Age-related nuclear cataract, left eye: Secondary | ICD-10-CM | POA: Diagnosis not present

## 2023-05-24 DIAGNOSIS — H25012 Cortical age-related cataract, left eye: Secondary | ICD-10-CM | POA: Diagnosis not present

## 2023-05-24 DIAGNOSIS — H25812 Combined forms of age-related cataract, left eye: Secondary | ICD-10-CM | POA: Diagnosis not present

## 2023-05-30 DIAGNOSIS — Z23 Encounter for immunization: Secondary | ICD-10-CM | POA: Diagnosis not present

## 2023-06-07 DIAGNOSIS — H25811 Combined forms of age-related cataract, right eye: Secondary | ICD-10-CM | POA: Diagnosis not present

## 2023-06-07 DIAGNOSIS — H25011 Cortical age-related cataract, right eye: Secondary | ICD-10-CM | POA: Diagnosis not present

## 2023-06-07 DIAGNOSIS — H2511 Age-related nuclear cataract, right eye: Secondary | ICD-10-CM | POA: Diagnosis not present

## 2023-09-04 ENCOUNTER — Other Ambulatory Visit: Payer: Self-pay | Admitting: Podiatry

## 2023-09-11 ENCOUNTER — Encounter: Payer: Self-pay | Admitting: Podiatrist

## 2023-09-11 ENCOUNTER — Ambulatory Visit: Payer: Medicare Other | Admitting: Podiatry

## 2023-09-11 ENCOUNTER — Ambulatory Visit: Payer: Medicare Other | Admitting: Podiatrist

## 2023-09-11 DIAGNOSIS — M109 Gout, unspecified: Secondary | ICD-10-CM

## 2023-09-11 MED ORDER — COLCHICINE 0.6 MG PO TABS: ORAL_TABLET | ORAL | 3 refills | Status: AC

## 2023-09-11 NOTE — Progress Notes (Signed)
 Chief Complaint  Patient presents with   medication check    RM#13 Medication check     HPI: Patient is 78 y.o. female who presents today for her yearly medication check in.  She relates she has gout and it is kept under control with colchicine .  She states she is otherwise doing well.  Has had few flares and her feet are feeling good today.    Allergies  Allergen Reactions   Allopurinol Other (See Comments)    Can not take due to kidney function   Pravachol [Pravastatin]     Did not agree with her    Review of systems is negative except as noted in the HPI.  Denies nausea/ vomiting/ fevers/ chills or night sweats.   Denies difficulty breathing, denies calf pain or tenderness  Physical Exam  Patient is awake, alert, and oriented x 3.  In no acute distress.    Vascular status is intact with palpable pedal pulses DP and PT bilateral and capillary refill time less than 3 seconds bilateral.  No edema or erythema noted.   Neurological exam reveals epicritic and protective sensation grossly intact bilateral.   Dermatological exam reveals skin is supple and dry to bilateral feet.  No open lesions present.    Musculoskeletal exam: Musculature intact with dorsiflexion, plantarflexion, inversion, eversion. Ankle and First MPJ joint range of motion normal.     Assessment: History of gout- relieved with periodic colchicine  use.   Plan: Colchicine  refill #90 x 3 refills  Return in a year for med check

## 2023-09-13 ENCOUNTER — Ambulatory Visit: Payer: Medicare Other | Admitting: Podiatry

## 2023-10-25 DIAGNOSIS — C44519 Basal cell carcinoma of skin of other part of trunk: Secondary | ICD-10-CM | POA: Diagnosis not present

## 2023-10-29 ENCOUNTER — Ambulatory Visit: Payer: Medicare Other | Admitting: Hematology and Oncology

## 2023-11-02 ENCOUNTER — Other Ambulatory Visit: Payer: Self-pay | Admitting: Family Medicine

## 2023-11-02 DIAGNOSIS — Z1231 Encounter for screening mammogram for malignant neoplasm of breast: Secondary | ICD-10-CM

## 2023-11-02 DIAGNOSIS — Z9889 Other specified postprocedural states: Secondary | ICD-10-CM

## 2023-11-02 DIAGNOSIS — Z853 Personal history of malignant neoplasm of breast: Secondary | ICD-10-CM

## 2023-11-12 ENCOUNTER — Inpatient Hospital Stay: Payer: Medicare Other | Attending: Hematology and Oncology | Admitting: Hematology and Oncology

## 2023-11-12 VITALS — BP 143/79 | HR 70 | Temp 97.5°F | Resp 18 | Ht 65.0 in | Wt 164.1 lb

## 2023-11-12 DIAGNOSIS — Z79899 Other long term (current) drug therapy: Secondary | ICD-10-CM | POA: Insufficient documentation

## 2023-11-12 DIAGNOSIS — N644 Mastodynia: Secondary | ICD-10-CM | POA: Diagnosis not present

## 2023-11-12 DIAGNOSIS — C50311 Malignant neoplasm of lower-inner quadrant of right female breast: Secondary | ICD-10-CM | POA: Insufficient documentation

## 2023-11-12 DIAGNOSIS — M79673 Pain in unspecified foot: Secondary | ICD-10-CM | POA: Insufficient documentation

## 2023-11-12 DIAGNOSIS — C4441 Basal cell carcinoma of skin of scalp and neck: Secondary | ICD-10-CM | POA: Diagnosis not present

## 2023-11-12 DIAGNOSIS — I959 Hypotension, unspecified: Secondary | ICD-10-CM | POA: Insufficient documentation

## 2023-11-12 DIAGNOSIS — Z170 Estrogen receptor positive status [ER+]: Secondary | ICD-10-CM | POA: Diagnosis not present

## 2023-11-12 DIAGNOSIS — L905 Scar conditions and fibrosis of skin: Secondary | ICD-10-CM | POA: Insufficient documentation

## 2023-11-12 DIAGNOSIS — Z79811 Long term (current) use of aromatase inhibitors: Secondary | ICD-10-CM | POA: Diagnosis not present

## 2023-11-12 DIAGNOSIS — Z1721 Progesterone receptor positive status: Secondary | ICD-10-CM | POA: Insufficient documentation

## 2023-11-12 DIAGNOSIS — M8580 Other specified disorders of bone density and structure, unspecified site: Secondary | ICD-10-CM | POA: Insufficient documentation

## 2023-11-12 MED ORDER — LETROZOLE 2.5 MG PO TABS: 2.5000 mg | ORAL_TABLET | Freq: Every day | ORAL | 3 refills | Status: AC

## 2023-11-12 NOTE — Assessment & Plan Note (Signed)
 12/23/2021:Screening mammogram detected right breast mass 0.7 cm at 4 o'clock position.  Axilla negative, biopsy revealed grade 2 IDC ER 95%, PR 95%, HER2 equivocal by IHC, FISH pending, Ki-67 less than 5%   02/01/22: Rt Lumpectomy: Grade 1 IDC with DCIS 1 cm size, ER 95%, PR 95%, Her 2 Neg, Ki 67:<5% 02/22/2022: Margin reexcision: Benign   Current treatment: Letrozole started 02/14/2022 Letrozole toxicities: Tolerating it extremely well without any problems.   Numbness of the tips of the fingers of right hand: Patient is going to check if it is carpal tunnel.   Breast cancer surveillance: Breast exam 11/12/2023: Benign Mammogram 12/11/2022: Benign breast density category C   Return to clinic in 1 year for follow-up

## 2023-11-12 NOTE — Progress Notes (Signed)
 Patient Care Team: Clovis Riley, L.August Saucer, MD (Inactive) as PCP - General (Family Medicine) Abigail Miyamoto, MD as Consulting Physician (General Surgery) Serena Croissant, MD as Consulting Physician (Hematology and Oncology) Dorothy Puffer, MD as Consulting Physician (Radiation Oncology)  DIAGNOSIS:  Encounter Diagnosis  Name Primary?   Malignant neoplasm of lower-inner quadrant of right breast of female, estrogen receptor positive ([facility]) Yes    SUMMARY OF ONCOLOGIC HISTORY: Oncology History  Malignant neoplasm of lower-inner quadrant of right female breast ([facility])  12/23/2021 Initial Diagnosis   Screening mammogram detected right breast mass 0.7 cm at 4 o'clock position.  Axilla negative, biopsy revealed grade 2 IDC ER 95%, PR 95%, HER2 equivocal by IHC, FISH pending, Ki-67 less than 5%   02/01/2022 Surgery   A. BREAST, RIGHT, LUMPECTOMY:  Invasive ductal carcinoma.  Ductal carcinoma in situ.  See surgical pathology cancer case summary for details.  INVASIVE CARCINOMA OF THE BREAST:  Resection    02/14/2022 -  Anti-estrogen oral therapy   Letrozole x 5 years     CHIEF COMPLIANT: Follow-up on letrozole therapy  HISTORY OF PRESENT ILLNESS:   History of Present Illness The patient, with a history of hypertension and breast cancer, presents for an annual follow-up. She reports that her primary care physician recently discontinued her benazepril due to hypotension. She has been on letrozole for two years for breast cancer and denies any side effects. She has been diligent with her mammograms, with the next one scheduled for April 16th. She occasionally experiences a stabbing sensation in her breast, which she attributes to scar tissue. She also reports a recent diagnosis of basal cell carcinoma, which was treated with excision. She has been exercising four times a week and taking vitamin D for osteopenia. She recently tried taking B12 for low energy but stopped due to foot pain, which she was  unsure if it was due to the B12 or her gout.     ALLERGIES:  is allergic to allopurinol and pravachol [pravastatin].  MEDICATIONS:  Current Outpatient Medications  Medication Sig Dispense Refill   colchicine 0.6 MG tablet TAKE ONE TABLET BY MOUTH THREE TIMES A DAY UNTIL GI UPSET OR FLARE SUBSIDES; THEN FOR MAINTENANCE, TAKE 1 TABLET DAILY 90 tablet 3   letrozole (FEMARA) 2.5 MG tablet Take 1 tablet (2.5 mg total) by mouth daily. 90 tablet 3   Multiple Vitamin (MULTIVITAMINS PO) Take 1 tablet by mouth daily.     Omega-3 Fatty Acids (FISH OIL PO) Take 1 capsule by mouth daily.     simvastatin (ZOCOR) 40 MG tablet Take 40 mg by mouth daily in the afternoon.     No current facility-administered medications for this visit.    PHYSICAL EXAMINATION: ECOG PERFORMANCE STATUS: 1 - Symptomatic but completely ambulatory  Vitals:   11/12/23 1133  BP: (!) 143/79  Pulse: 70  Resp: 18  Temp: (!) 97.5 F (36.4 C)  SpO2: 97%   Filed Weights   11/12/23 1133  Weight: 164 lb 1.6 oz (74.4 kg)    Physical Exam No palpable lumps or nodules bilateral breasts or axilla.  There is a depression on the right breast from prior surgery.  (exam performed in the presence of a chaperone)  LABORATORY DATA:  I have reviewed the data as listed    Latest Ref Rng & Units 02/22/2022   12:00 PM 01/04/2022   11:59 AM  CMP  Glucose 70 - 99 mg/dL 83  94   BUN 8 - 23 mg/dL 15  25   Creatinine 0.44 - 1.00 mg/dL 8.29  5.62   Sodium 130 - 145 mmol/L 141  140   Potassium 3.5 - 5.1 mmol/L 4.4  4.5   Chloride 98 - 111 mmol/L 106  104   CO2 22 - 32 mmol/L 23  29   Calcium 8.9 - 10.3 mg/dL 9.9  86.5   Total Protein 6.5 - 8.1 g/dL  7.8   Total Bilirubin 0.3 - 1.2 mg/dL  0.7   Alkaline Phos 38 - 126 U/L  81   AST 15 - 41 U/L  21   ALT 0 - 44 U/L  21     Lab Results  Component Value Date   WBC 10.6 (H) 02/22/2022   HGB 16.2 (H) 02/22/2022   HCT 49.0 (H) 02/22/2022   MCV 103.6 (H) 02/22/2022   PLT 255  02/22/2022   NEUTROABS 5.0 01/04/2022    ASSESSMENT & PLAN:  Malignant neoplasm of lower-inner quadrant of right female breast ([facility]) 12/23/2021:Screening mammogram detected right breast mass 0.7 cm at 4 o'clock position.  Axilla negative, biopsy revealed grade 2 IDC ER 95%, PR 95%, HER2 equivocal by IHC, FISH pending, Ki-67 less than 5%   02/01/22: Rt Lumpectomy: Grade 1 IDC with DCIS 1 cm size, ER 95%, PR 95%, Her 2 Neg, Ki 67:<5% 02/22/2022: Margin reexcision: Benign   Current treatment: Letrozole started 02/14/2022 Letrozole toxicities: Tolerating it extremely well without any problems.   Numbness of the tips of the fingers of right hand: Patient is going to check if it is carpal tunnel.   Breast cancer surveillance: Breast exam 11/12/2023: Benign Mammogram 12/11/2022: Benign breast density category C   Return to clinic in 1 year for follow-up ------------------------------------- Assessment and Plan Assessment & Plan Malignant neoplasm of lower-inner quadrant of right breast, estrogen receptor positive On letrozole for two years with occasional breast pain due to scar tissue. Prefers not to know letrozole side effects. - Continue letrozole 2.5 mg oral daily. - Perform breast exam. - Ensure mammogram is scheduled for April 16th. - Provide a year's worth of medication refills.  Basal cell carcinoma Basal cell carcinoma removed from neckline. Good prognosis post-removal.  Osteopenia Osteopenia with T-score of -2.0. Advised on vitamin D, calcium, and exercise. Vitamin B12 not beneficial for bone health. - Continue vitamin D supplementation. - Encourage regular exercise. - Schedule bone density test in September 2025.  Follow-up Well-managed on current regimen. - Schedule follow-up appointment in one year.      No orders of the defined types were placed in this encounter.  The patient has a good understanding of the overall plan. she agrees with it. she will call with any  problems that may develop before the next visit here. Total time spent: 30 mins including face to face time and time spent for planning, charting and co-ordination of care   Tamsen Meek, MD 11/12/23

## 2023-11-21 DIAGNOSIS — E7800 Pure hypercholesterolemia, unspecified: Secondary | ICD-10-CM | POA: Diagnosis not present

## 2023-11-21 DIAGNOSIS — M109 Gout, unspecified: Secondary | ICD-10-CM | POA: Diagnosis not present

## 2023-11-21 DIAGNOSIS — M8588 Other specified disorders of bone density and structure, other site: Secondary | ICD-10-CM | POA: Diagnosis not present

## 2023-11-21 DIAGNOSIS — I10 Essential (primary) hypertension: Secondary | ICD-10-CM | POA: Diagnosis not present

## 2023-11-23 ENCOUNTER — Other Ambulatory Visit: Payer: Self-pay | Admitting: Family Medicine

## 2023-11-23 DIAGNOSIS — Z0000 Encounter for general adult medical examination without abnormal findings: Secondary | ICD-10-CM | POA: Diagnosis not present

## 2023-11-23 DIAGNOSIS — C50919 Malignant neoplasm of unspecified site of unspecified female breast: Secondary | ICD-10-CM | POA: Diagnosis not present

## 2023-11-23 DIAGNOSIS — N1832 Chronic kidney disease, stage 3b: Secondary | ICD-10-CM | POA: Diagnosis not present

## 2023-11-23 DIAGNOSIS — M8580 Other specified disorders of bone density and structure, unspecified site: Secondary | ICD-10-CM

## 2023-11-23 DIAGNOSIS — E7800 Pure hypercholesterolemia, unspecified: Secondary | ICD-10-CM | POA: Diagnosis not present

## 2023-11-23 DIAGNOSIS — M8588 Other specified disorders of bone density and structure, other site: Secondary | ICD-10-CM | POA: Diagnosis not present

## 2023-11-23 DIAGNOSIS — K219 Gastro-esophageal reflux disease without esophagitis: Secondary | ICD-10-CM | POA: Diagnosis not present

## 2023-11-23 DIAGNOSIS — R809 Proteinuria, unspecified: Secondary | ICD-10-CM | POA: Diagnosis not present

## 2023-11-23 DIAGNOSIS — M109 Gout, unspecified: Secondary | ICD-10-CM | POA: Diagnosis not present

## 2023-11-23 DIAGNOSIS — I10 Essential (primary) hypertension: Secondary | ICD-10-CM | POA: Diagnosis not present

## 2023-12-06 DIAGNOSIS — Z08 Encounter for follow-up examination after completed treatment for malignant neoplasm: Secondary | ICD-10-CM | POA: Diagnosis not present

## 2023-12-06 DIAGNOSIS — Z85828 Personal history of other malignant neoplasm of skin: Secondary | ICD-10-CM | POA: Diagnosis not present

## 2023-12-12 ENCOUNTER — Ambulatory Visit
Admission: RE | Admit: 2023-12-12 | Discharge: 2023-12-12 | Disposition: A | Discharge status: Home / Self Care | Source: Ambulatory Visit | Attending: Family Medicine | Admitting: Family Medicine

## 2023-12-12 DIAGNOSIS — Z9889 Other specified postprocedural states: Secondary | ICD-10-CM

## 2023-12-12 DIAGNOSIS — Z853 Personal history of malignant neoplasm of breast: Secondary | ICD-10-CM

## 2023-12-12 DIAGNOSIS — Z1231 Encounter for screening mammogram for malignant neoplasm of breast: Secondary | ICD-10-CM

## 2023-12-12 DIAGNOSIS — Z08 Encounter for follow-up examination after completed treatment for malignant neoplasm: Secondary | ICD-10-CM | POA: Diagnosis not present

## 2023-12-18 DIAGNOSIS — R350 Frequency of micturition: Secondary | ICD-10-CM | POA: Diagnosis not present

## 2023-12-18 DIAGNOSIS — R42 Dizziness and giddiness: Secondary | ICD-10-CM | POA: Diagnosis not present

## 2023-12-18 DIAGNOSIS — I10 Essential (primary) hypertension: Secondary | ICD-10-CM | POA: Diagnosis not present

## 2024-05-27 DIAGNOSIS — E7800 Pure hypercholesterolemia, unspecified: Secondary | ICD-10-CM | POA: Diagnosis not present

## 2024-05-27 DIAGNOSIS — N1832 Chronic kidney disease, stage 3b: Secondary | ICD-10-CM | POA: Diagnosis not present

## 2024-05-27 DIAGNOSIS — M8580 Other specified disorders of bone density and structure, unspecified site: Secondary | ICD-10-CM | POA: Diagnosis not present

## 2024-05-27 DIAGNOSIS — I10 Essential (primary) hypertension: Secondary | ICD-10-CM | POA: Diagnosis not present

## 2024-05-29 DIAGNOSIS — I10 Essential (primary) hypertension: Secondary | ICD-10-CM | POA: Diagnosis not present

## 2024-05-29 DIAGNOSIS — N1832 Chronic kidney disease, stage 3b: Secondary | ICD-10-CM | POA: Diagnosis not present

## 2024-05-29 DIAGNOSIS — N816 Rectocele: Secondary | ICD-10-CM | POA: Diagnosis not present

## 2024-05-29 DIAGNOSIS — C50919 Malignant neoplasm of unspecified site of unspecified female breast: Secondary | ICD-10-CM | POA: Diagnosis not present

## 2024-05-29 DIAGNOSIS — M109 Gout, unspecified: Secondary | ICD-10-CM | POA: Diagnosis not present

## 2024-05-29 DIAGNOSIS — M8588 Other specified disorders of bone density and structure, other site: Secondary | ICD-10-CM | POA: Diagnosis not present

## 2024-05-29 DIAGNOSIS — E7800 Pure hypercholesterolemia, unspecified: Secondary | ICD-10-CM | POA: Diagnosis not present

## 2024-06-05 DIAGNOSIS — Z08 Encounter for follow-up examination after completed treatment for malignant neoplasm: Secondary | ICD-10-CM | POA: Diagnosis not present

## 2024-06-05 DIAGNOSIS — X32XXXA Exposure to sunlight, initial encounter: Secondary | ICD-10-CM | POA: Diagnosis not present

## 2024-06-05 DIAGNOSIS — Z85828 Personal history of other malignant neoplasm of skin: Secondary | ICD-10-CM | POA: Diagnosis not present

## 2024-06-05 DIAGNOSIS — L570 Actinic keratosis: Secondary | ICD-10-CM | POA: Diagnosis not present

## 2024-08-08 ENCOUNTER — Other Ambulatory Visit

## 2024-08-12 ENCOUNTER — Encounter: Payer: Self-pay | Admitting: Podiatry

## 2024-08-12 ENCOUNTER — Ambulatory Visit: Admitting: Podiatry

## 2024-08-12 DIAGNOSIS — I82401 Acute embolism and thrombosis of unspecified deep veins of right lower extremity: Secondary | ICD-10-CM

## 2024-08-12 MED ORDER — CYCLOBENZAPRINE HCL 10 MG PO TABS: 10.0000 mg | ORAL_TABLET | Freq: Three times a day (TID) | ORAL | 0 refills | Status: AC | PRN

## 2024-08-12 MED ORDER — METHYLPREDNISOLONE 4 MG PO TBPK: ORAL_TABLET | ORAL | 0 refills | Status: AC

## 2024-08-12 NOTE — Progress Notes (Signed)
 She presents today after having not seen her for more than a year complaining mostly of calf pain to the right leg states that she thought she was having a gout attack so she started taking her colchicine .  But noticed that her foot got better but the leg continued to cramp.  She states that she had spasms there was so horrible for the past 2 nights that she can barely walk on her leg now she states that it feels hot and swollen though it has come down over the past day or so.  Objective: Vital signs are stable alert oriented x 3 warm swollen hot leg right lower extremity pulses are palpable onto the foot.  She has tenderness on medial-lateral compression of the calf.  Her Achilles is intact.  Assessment: Positive calf pain cannot rule out a DVT without ultrasound.  I do suspect most likely a tear of the gastrosoleus complex due to the spasms that would result in redness and warmth and pain.  Plan: At this point I will put her on Flexeril  in case this happens again also go started on a steroid Dosepak.  Encouraged her to take an adult aspirin daily.  I also ordered a stat ultrasound for a DVT rule out.  I will follow-up with her in 2 weeks.  If the DVT is present vascular will take care of that.

## 2024-08-13 ENCOUNTER — Ambulatory Visit (HOSPITAL_COMMUNITY): Admission: RE | Admit: 2024-08-13 | Discharge: 2024-08-13 | Attending: Podiatry | Admitting: Podiatry

## 2024-08-13 ENCOUNTER — Ambulatory Visit: Payer: Self-pay | Admitting: Podiatry

## 2024-08-13 DIAGNOSIS — I82401 Acute embolism and thrombosis of unspecified deep veins of right lower extremity: Secondary | ICD-10-CM | POA: Diagnosis present

## 2024-08-13 NOTE — Progress Notes (Signed)
 LVM requesting patient to call back to discuss.

## 2024-08-14 NOTE — Progress Notes (Signed)
 Patient informed.

## 2024-08-14 NOTE — Telephone Encounter (Signed)
Patient returned call. Please call patient back

## 2024-08-26 ENCOUNTER — Ambulatory Visit (HOSPITAL_BASED_OUTPATIENT_CLINIC_OR_DEPARTMENT_OTHER)
Admission: RE | Admit: 2024-08-26 | Discharge: 2024-08-26 | Disposition: A | Discharge status: Home / Self Care | Source: Ambulatory Visit | Attending: Family Medicine | Admitting: Family Medicine

## 2024-08-26 DIAGNOSIS — M810 Age-related osteoporosis without current pathological fracture: Secondary | ICD-10-CM | POA: Diagnosis not present

## 2024-08-26 DIAGNOSIS — M8580 Other specified disorders of bone density and structure, unspecified site: Secondary | ICD-10-CM | POA: Diagnosis present

## 2024-09-16 ENCOUNTER — Ambulatory Visit: Payer: Medicare Other | Admitting: Podiatry

## 2024-09-16 DIAGNOSIS — I872 Venous insufficiency (chronic) (peripheral): Secondary | ICD-10-CM

## 2024-09-16 NOTE — Progress Notes (Signed)
 She presents today for follow-up of her vascular study for DVT of her right calf.  She states that after that came back negative she started taking Tylenol  which made the right leg get better.  She states that now when I stand up at night or anytime that I am walking my legs feel very very heavy like I can hardly move them.  Objective: Vital signs are stable she is alert oriented x 3 pulses are strong and palpable +3/4 DP PT and capillary fill time is immediate.  Feet are warm to the touch she has good muscular tone dorsiflexors plantar flexors inverters and everters all intrinsic musculature is intact.  She has no calf pain on palpation.  She has some stiffness behind her left knee.  There is some mild edema along the medial ankle but not very much at all.  This does not appear to be pitting.  Assessment: Possible venous insufficiency without pitting without rash just complaining of heavy legs.  Plan: Discussed etiology pathology conservative versus surgical therapies requesting venous insufficiency studies.  Should this be negative referral to Dr. Yvone for orthopedics.

## 2024-09-17 ENCOUNTER — Ambulatory Visit (HOSPITAL_COMMUNITY)
Admission: RE | Admit: 2024-09-17 | Discharge: 2024-09-17 | Disposition: A | Discharge status: Home / Self Care | Source: Ambulatory Visit | Attending: Podiatry | Admitting: Podiatry

## 2024-09-17 DIAGNOSIS — I872 Venous insufficiency (chronic) (peripheral): Secondary | ICD-10-CM | POA: Insufficient documentation

## 2024-10-01 ENCOUNTER — Ambulatory Visit: Payer: Self-pay | Admitting: Podiatry

## 2024-11-11 ENCOUNTER — Inpatient Hospital Stay: Payer: Self-pay | Admitting: Hematology and Oncology
